# Patient Record
Sex: Male | Born: 1985 | Race: Black or African American | Hispanic: No | Marital: Single | Smoking: Current every day smoker
Health system: Southern US, Community
[De-identification: ages and names within clinical notes are randomized; demographics above are authoritative.]

## PROBLEM LIST (undated history)

## (undated) DIAGNOSIS — J189 Pneumonia, unspecified organism: Secondary | ICD-10-CM

## (undated) DIAGNOSIS — K219 Gastro-esophageal reflux disease without esophagitis: Secondary | ICD-10-CM

## (undated) DIAGNOSIS — J40 Bronchitis, not specified as acute or chronic: Secondary | ICD-10-CM

## (undated) DIAGNOSIS — J302 Other seasonal allergic rhinitis: Secondary | ICD-10-CM

## (undated) HISTORY — PX: ADENOIDECTOMY: SUR15

## (undated) HISTORY — PX: TONSILLECTOMY: SUR1361

---
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
## 2004-07-30 ENCOUNTER — Emergency Department (HOSPITAL_COMMUNITY): Admission: EM | Admit: 2004-07-30 | Discharge: 2004-07-31 | Payer: Self-pay | Admitting: Emergency Medicine

## 2004-08-09 ENCOUNTER — Emergency Department (HOSPITAL_COMMUNITY): Admission: EM | Admit: 2004-08-09 | Discharge: 2004-08-09 | Payer: Self-pay | Admitting: Emergency Medicine

## 2004-08-10 ENCOUNTER — Emergency Department (HOSPITAL_COMMUNITY): Admission: EM | Admit: 2004-08-10 | Discharge: 2004-08-10 | Payer: Self-pay | Admitting: Emergency Medicine

## 2004-09-17 ENCOUNTER — Emergency Department (HOSPITAL_COMMUNITY): Admission: EM | Admit: 2004-09-17 | Discharge: 2004-09-17 | Payer: Self-pay | Admitting: Emergency Medicine

## 2005-07-22 ENCOUNTER — Emergency Department (HOSPITAL_COMMUNITY): Admission: EM | Admit: 2005-07-22 | Discharge: 2005-07-23 | Payer: Self-pay | Admitting: Emergency Medicine

## 2005-07-27 ENCOUNTER — Emergency Department (HOSPITAL_COMMUNITY): Admission: EM | Admit: 2005-07-27 | Discharge: 2005-07-27 | Payer: Self-pay | Admitting: Family Medicine

## 2006-02-12 ENCOUNTER — Emergency Department (HOSPITAL_COMMUNITY): Admission: EM | Admit: 2006-02-12 | Discharge: 2006-02-12 | Payer: Self-pay | Admitting: Family Medicine

## 2006-09-23 ENCOUNTER — Emergency Department (HOSPITAL_COMMUNITY): Admission: EM | Admit: 2006-09-23 | Discharge: 2006-09-23 | Payer: Self-pay | Admitting: Emergency Medicine

## 2006-10-02 ENCOUNTER — Emergency Department (HOSPITAL_COMMUNITY): Admission: EM | Admit: 2006-10-02 | Discharge: 2006-10-02 | Payer: Self-pay | Admitting: Emergency Medicine

## 2006-10-13 ENCOUNTER — Emergency Department (HOSPITAL_COMMUNITY): Admission: EM | Admit: 2006-10-13 | Discharge: 2006-10-14 | Payer: Self-pay | Admitting: Emergency Medicine

## 2007-02-13 ENCOUNTER — Emergency Department (HOSPITAL_COMMUNITY): Admission: EM | Admit: 2007-02-13 | Discharge: 2007-02-13 | Payer: Self-pay | Admitting: Emergency Medicine

## 2007-02-16 ENCOUNTER — Emergency Department (HOSPITAL_COMMUNITY): Admission: EM | Admit: 2007-02-16 | Discharge: 2007-02-17 | Payer: Self-pay | Admitting: *Deleted

## 2007-03-02 ENCOUNTER — Emergency Department (HOSPITAL_COMMUNITY): Admission: EM | Admit: 2007-03-02 | Discharge: 2007-03-03 | Payer: Self-pay | Admitting: Emergency Medicine

## 2007-03-24 ENCOUNTER — Emergency Department (HOSPITAL_COMMUNITY): Admission: EM | Admit: 2007-03-24 | Discharge: 2007-03-24 | Payer: Self-pay | Admitting: Family Medicine

## 2007-05-01 ENCOUNTER — Emergency Department (HOSPITAL_COMMUNITY): Admission: EM | Admit: 2007-05-01 | Discharge: 2007-05-01 | Payer: Self-pay | Admitting: Emergency Medicine

## 2007-05-20 ENCOUNTER — Emergency Department (HOSPITAL_COMMUNITY): Admission: EM | Admit: 2007-05-20 | Discharge: 2007-05-21 | Payer: Self-pay | Admitting: Emergency Medicine

## 2007-05-25 ENCOUNTER — Emergency Department (HOSPITAL_COMMUNITY): Admission: EM | Admit: 2007-05-25 | Discharge: 2007-05-25 | Payer: Self-pay | Admitting: Emergency Medicine

## 2007-05-26 ENCOUNTER — Emergency Department (HOSPITAL_COMMUNITY): Admission: EM | Admit: 2007-05-26 | Discharge: 2007-05-26 | Payer: Self-pay | Admitting: Emergency Medicine

## 2007-07-18 ENCOUNTER — Emergency Department (HOSPITAL_COMMUNITY): Admission: EM | Admit: 2007-07-18 | Discharge: 2007-07-18 | Payer: Self-pay | Admitting: Family Medicine

## 2007-09-12 ENCOUNTER — Emergency Department (HOSPITAL_COMMUNITY): Admission: EM | Admit: 2007-09-12 | Discharge: 2007-09-12 | Payer: Self-pay | Admitting: Emergency Medicine

## 2007-10-26 ENCOUNTER — Emergency Department (HOSPITAL_COMMUNITY): Admission: EM | Admit: 2007-10-26 | Discharge: 2007-10-26 | Payer: Self-pay | Admitting: Emergency Medicine

## 2008-01-26 ENCOUNTER — Emergency Department (HOSPITAL_COMMUNITY): Admission: EM | Admit: 2008-01-26 | Discharge: 2008-01-27 | Payer: Self-pay | Admitting: Emergency Medicine

## 2008-06-14 ENCOUNTER — Emergency Department (HOSPITAL_COMMUNITY): Admission: EM | Admit: 2008-06-14 | Discharge: 2008-06-14 | Payer: Self-pay | Admitting: Emergency Medicine

## 2008-06-27 ENCOUNTER — Emergency Department (HOSPITAL_COMMUNITY): Admission: EM | Admit: 2008-06-27 | Discharge: 2008-06-27 | Payer: Self-pay | Admitting: Family Medicine

## 2008-08-24 ENCOUNTER — Emergency Department (HOSPITAL_COMMUNITY): Admission: EM | Admit: 2008-08-24 | Discharge: 2008-08-24 | Payer: Self-pay | Admitting: Family Medicine

## 2008-12-18 ENCOUNTER — Emergency Department (HOSPITAL_COMMUNITY): Admission: EM | Admit: 2008-12-18 | Discharge: 2008-12-18 | Payer: Self-pay | Admitting: Emergency Medicine

## 2009-01-15 IMAGING — CR DG CHEST 2V
2 series · 2 of 2 positions shown · non-contrast
Comparison: 10/26/2007

CLINICAL DATA: Chest congestion and fever

CHEST - 2 VIEW

[w chest pa]
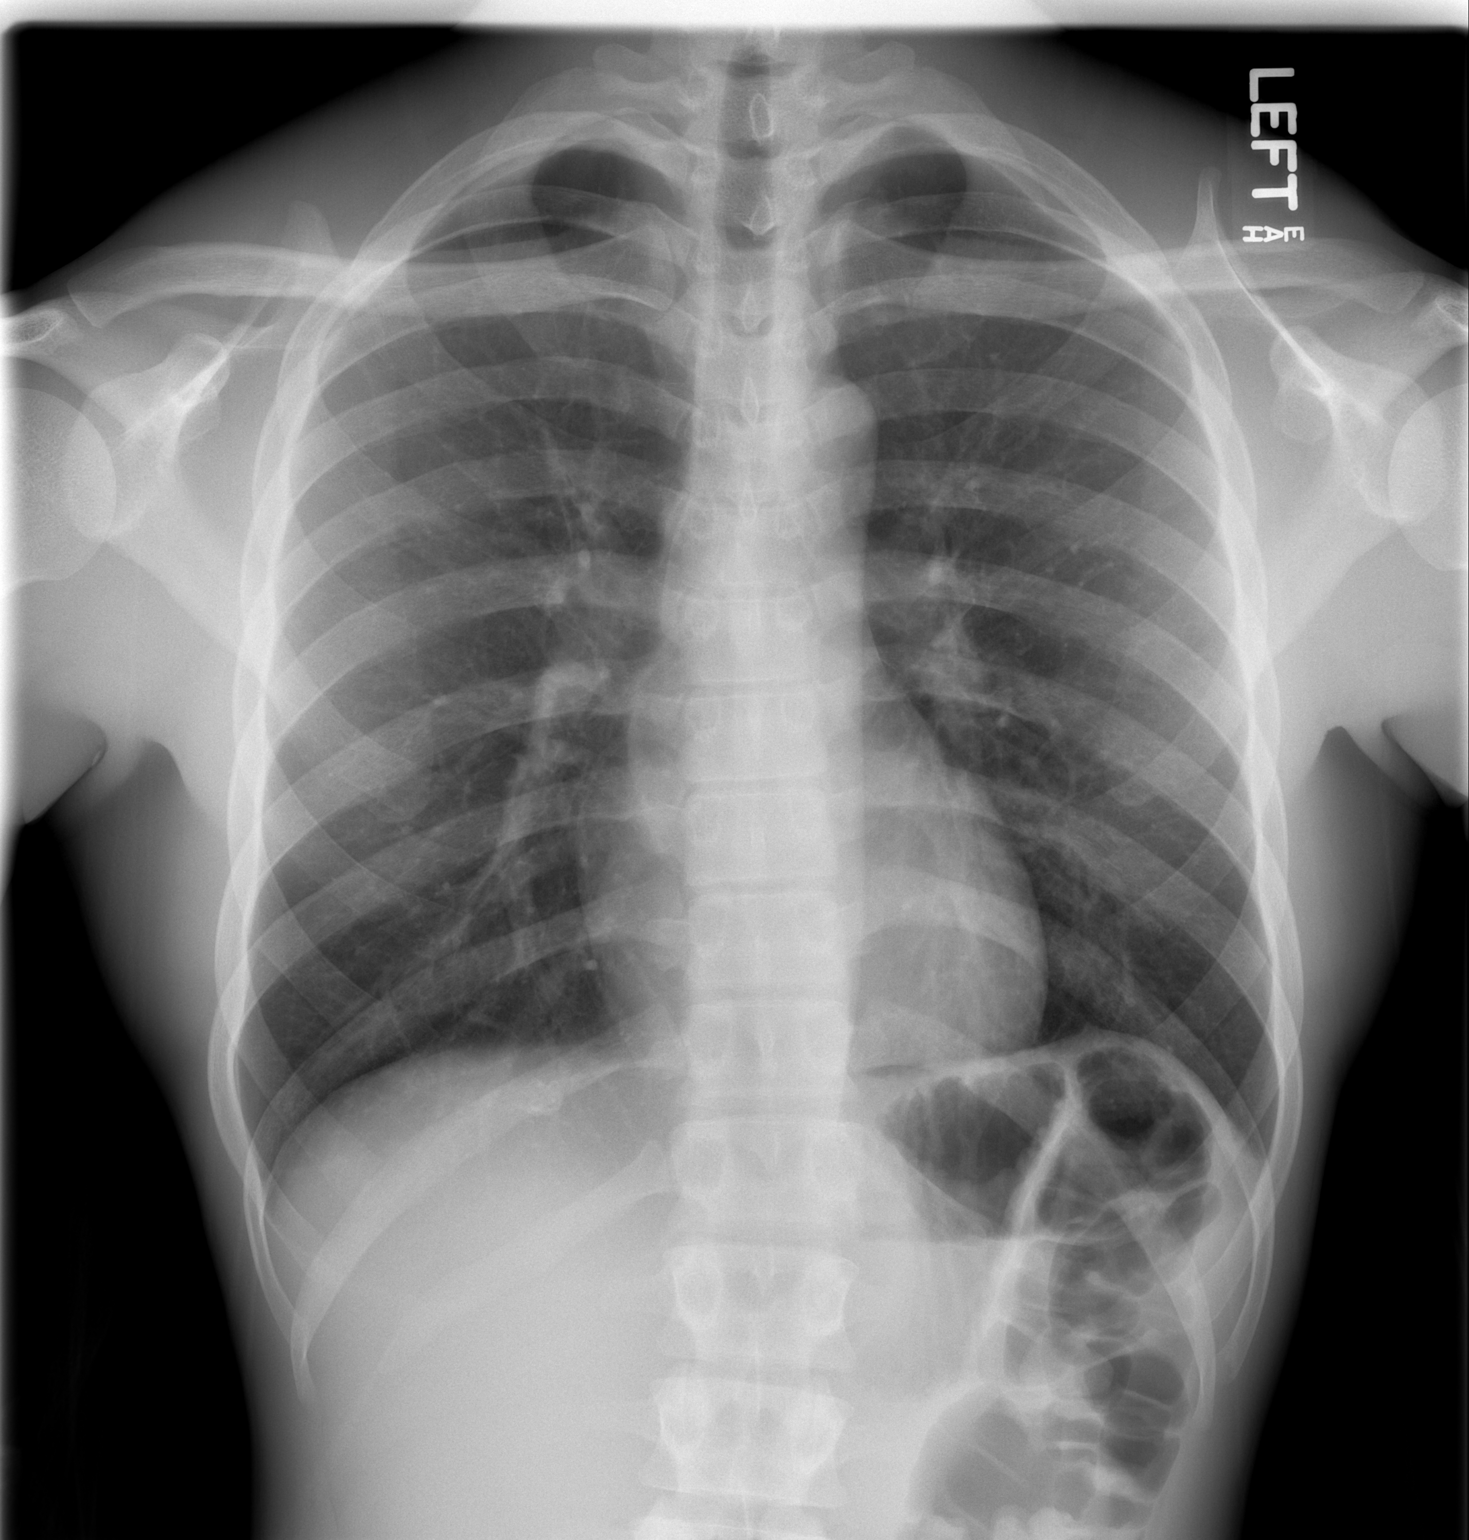

[w chest lat]
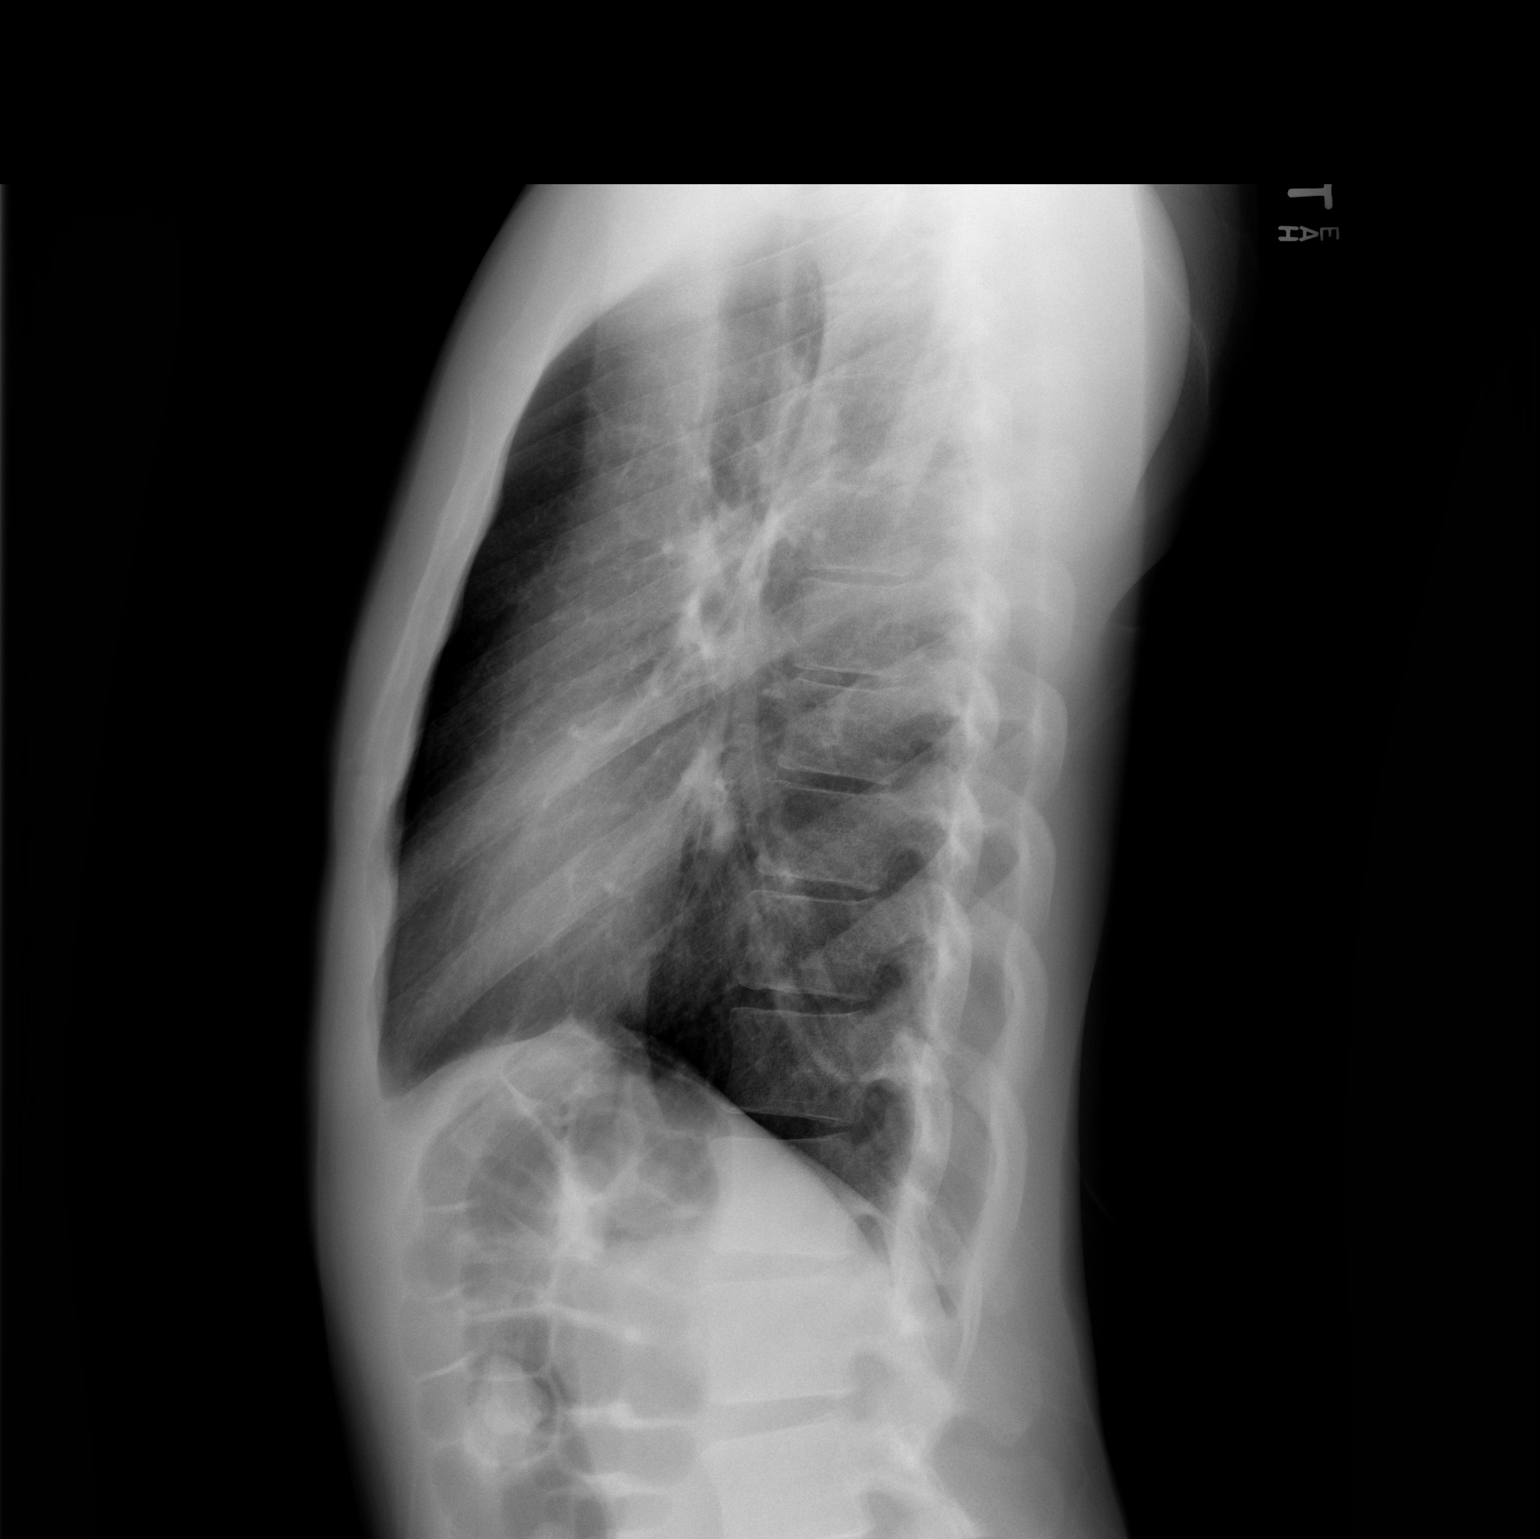

[2 of 2 positions shown; findings below may reference images not displayed]

FINDINGS: The heart size and mediastinal contours are within normal
limits.  Both lungs are clear.  The visualized skeletal structures
are unremarkable.
IMPRESSION: No acute cardiopulmonary abnormalities.

## 2009-08-10 ENCOUNTER — Emergency Department (HOSPITAL_COMMUNITY): Admission: EM | Admit: 2009-08-10 | Discharge: 2009-08-10 | Payer: Self-pay | Admitting: Emergency Medicine

## 2009-08-12 ENCOUNTER — Emergency Department (HOSPITAL_COMMUNITY): Admission: EM | Admit: 2009-08-12 | Discharge: 2009-08-12 | Payer: Self-pay | Admitting: Emergency Medicine

## 2009-08-13 ENCOUNTER — Emergency Department (HOSPITAL_COMMUNITY): Admission: EM | Admit: 2009-08-13 | Discharge: 2009-08-14 | Payer: Self-pay | Admitting: Emergency Medicine

## 2009-08-14 ENCOUNTER — Emergency Department (HOSPITAL_COMMUNITY): Admission: EM | Admit: 2009-08-14 | Discharge: 2009-08-14 | Payer: Self-pay | Admitting: Family Medicine

## 2009-08-31 ENCOUNTER — Emergency Department (HOSPITAL_COMMUNITY): Admission: EM | Admit: 2009-08-31 | Discharge: 2009-08-31 | Payer: Self-pay | Admitting: Emergency Medicine

## 2009-12-08 ENCOUNTER — Emergency Department (HOSPITAL_COMMUNITY): Admission: EM | Admit: 2009-12-08 | Discharge: 2009-12-08 | Payer: Self-pay | Admitting: Family Medicine

## 2009-12-12 ENCOUNTER — Emergency Department (HOSPITAL_COMMUNITY): Admission: EM | Admit: 2009-12-12 | Discharge: 2009-12-12 | Payer: Self-pay | Admitting: Emergency Medicine

## 2010-02-04 ENCOUNTER — Emergency Department (HOSPITAL_COMMUNITY)
Admission: EM | Admit: 2010-02-04 | Discharge: 2010-02-04 | Payer: Self-pay | Source: Home / Self Care | Admitting: Family Medicine

## 2010-02-13 ENCOUNTER — Emergency Department (HOSPITAL_COMMUNITY)
Admission: EM | Admit: 2010-02-13 | Discharge: 2010-02-13 | Payer: Self-pay | Source: Home / Self Care | Admitting: Emergency Medicine

## 2010-04-18 LAB — DIFFERENTIAL
Basophils Absolute: 0 10*3/uL (ref 0.0–0.1)
Basophils Relative: 0 % (ref 0–1)
Lymphocytes Relative: 8 % — ABNORMAL LOW (ref 12–46)
Lymphs Abs: 0.9 10*3/uL (ref 0.7–4.0)
Monocytes Relative: 9 % (ref 3–12)
Neutrophils Relative %: 84 % — ABNORMAL HIGH (ref 43–77)

## 2010-04-18 LAB — CBC
Hemoglobin: 14.2 g/dL (ref 13.0–17.0)
MCHC: 32 g/dL (ref 30.0–36.0)
RBC: 6.08 MIL/uL — ABNORMAL HIGH (ref 4.22–5.81)
WBC: 12.1 10*3/uL — ABNORMAL HIGH (ref 4.0–10.5)

## 2010-05-05 LAB — POCT I-STAT, CHEM 8
BUN: 7 mg/dL (ref 6–23)
Calcium, Ion: 1.23 mmol/L (ref 1.12–1.32)
Glucose, Bld: 93 mg/dL (ref 70–99)
Hemoglobin: 14.3 g/dL (ref 13.0–17.0)
Potassium: 3.8 mEq/L (ref 3.5–5.1)
Sodium: 140 mEq/L (ref 135–145)

## 2010-05-09 LAB — RPR: RPR Ser Ql: NONREACTIVE

## 2010-05-09 LAB — GC/CHLAMYDIA PROBE AMP, GENITAL: GC Probe Amp, Genital: NEGATIVE

## 2010-05-11 LAB — COMPREHENSIVE METABOLIC PANEL
AST: 19 U/L (ref 0–37)
Albumin: 3.6 g/dL (ref 3.5–5.2)
Alkaline Phosphatase: 60 U/L (ref 39–117)
BUN: 10 mg/dL (ref 6–23)
CO2: 22 mEq/L (ref 19–32)
Chloride: 109 mEq/L (ref 96–112)
GFR calc non Af Amer: >60 mL/min (ref >60)
Sodium: 138 mEq/L (ref 135–145)
Total Protein: 5.7 g/dL — ABNORMAL LOW (ref 6.0–8.3)

## 2010-05-11 LAB — CBC
MCV: 72.5 fL — ABNORMAL LOW (ref 78.0–100.0)
Platelets: 187 10*3/uL (ref 150–400)

## 2010-05-11 LAB — GC/CHLAMYDIA PROBE AMP, GENITAL: Chlamydia, DNA Probe: NEGATIVE

## 2010-05-11 LAB — DIFFERENTIAL
Basophils Absolute: 0 10*3/uL (ref 0.0–0.1)
Basophils Relative: 1 % (ref 0–1)
Eosinophils Relative: 3 % (ref 0–5)
Monocytes Absolute: 0.5 10*3/uL (ref 0.1–1.0)

## 2010-05-11 LAB — GLUCOSE, CAPILLARY: Glucose-Capillary: 88 mg/dL (ref 70–99)

## 2010-08-04 ENCOUNTER — Emergency Department (HOSPITAL_COMMUNITY)
Admission: EM | Admit: 2010-08-04 | Discharge: 2010-08-04 | Disposition: A | Discharge status: Home / Self Care | Payer: Self-pay | Attending: Emergency Medicine | Admitting: Emergency Medicine

## 2010-08-04 DIAGNOSIS — R079 Chest pain, unspecified: Secondary | ICD-10-CM | POA: Insufficient documentation

## 2010-08-04 DIAGNOSIS — K219 Gastro-esophageal reflux disease without esophagitis: Secondary | ICD-10-CM | POA: Insufficient documentation

## 2010-08-04 DIAGNOSIS — F1410 Cocaine abuse, uncomplicated: Secondary | ICD-10-CM | POA: Insufficient documentation

## 2010-08-04 DIAGNOSIS — R0602 Shortness of breath: Secondary | ICD-10-CM | POA: Insufficient documentation

## 2010-08-04 DIAGNOSIS — R0989 Other specified symptoms and signs involving the circulatory and respiratory systems: Secondary | ICD-10-CM | POA: Insufficient documentation

## 2010-08-04 DIAGNOSIS — Z79899 Other long term (current) drug therapy: Secondary | ICD-10-CM | POA: Insufficient documentation

## 2010-08-04 DIAGNOSIS — R0609 Other forms of dyspnea: Secondary | ICD-10-CM | POA: Insufficient documentation

## 2010-08-21 ENCOUNTER — Inpatient Hospital Stay (INDEPENDENT_AMBULATORY_CARE_PROVIDER_SITE_OTHER)
Admission: RE | Admit: 2010-08-21 | Discharge: 2010-08-21 | Disposition: A | Discharge status: Home / Self Care | Payer: Self-pay | Source: Ambulatory Visit | Attending: Family Medicine | Admitting: Family Medicine

## 2010-08-21 DIAGNOSIS — J069 Acute upper respiratory infection, unspecified: Secondary | ICD-10-CM

## 2010-09-02 ENCOUNTER — Emergency Department (HOSPITAL_COMMUNITY)
Admission: EM | Admit: 2010-09-02 | Discharge: 2010-09-03 | Disposition: A | Discharge status: Home / Self Care | Payer: Self-pay | Attending: Emergency Medicine | Admitting: Emergency Medicine

## 2010-09-02 ENCOUNTER — Emergency Department (HOSPITAL_COMMUNITY): Payer: Self-pay

## 2010-09-02 DIAGNOSIS — J40 Bronchitis, not specified as acute or chronic: Secondary | ICD-10-CM | POA: Insufficient documentation

## 2010-09-02 DIAGNOSIS — K219 Gastro-esophageal reflux disease without esophagitis: Secondary | ICD-10-CM | POA: Insufficient documentation

## 2010-09-02 DIAGNOSIS — R6883 Chills (without fever): Secondary | ICD-10-CM | POA: Insufficient documentation

## 2010-09-02 DIAGNOSIS — R05 Cough: Secondary | ICD-10-CM | POA: Insufficient documentation

## 2010-09-02 DIAGNOSIS — R059 Cough, unspecified: Secondary | ICD-10-CM | POA: Insufficient documentation

## 2010-10-07 ENCOUNTER — Inpatient Hospital Stay (INDEPENDENT_AMBULATORY_CARE_PROVIDER_SITE_OTHER)
Admission: RE | Admit: 2010-10-07 | Discharge: 2010-10-07 | Disposition: A | Discharge status: Home / Self Care | Payer: Self-pay | Source: Ambulatory Visit | Attending: Family Medicine | Admitting: Family Medicine

## 2010-10-07 DIAGNOSIS — R51 Headache: Secondary | ICD-10-CM

## 2010-10-07 DIAGNOSIS — Z733 Stress, not elsewhere classified: Secondary | ICD-10-CM

## 2010-10-07 LAB — GLUCOSE, CAPILLARY: Glucose-Capillary: 74 mg/dL (ref 70–99)

## 2010-10-11 ENCOUNTER — Emergency Department (HOSPITAL_COMMUNITY): Payer: Self-pay

## 2010-10-11 ENCOUNTER — Emergency Department (HOSPITAL_COMMUNITY)
Admission: EM | Admit: 2010-10-11 | Discharge: 2010-10-11 | Disposition: A | Discharge status: Home / Self Care | Payer: Self-pay | Attending: Emergency Medicine | Admitting: Emergency Medicine

## 2010-10-11 DIAGNOSIS — M545 Low back pain, unspecified: Secondary | ICD-10-CM | POA: Insufficient documentation

## 2010-10-11 DIAGNOSIS — M542 Cervicalgia: Secondary | ICD-10-CM | POA: Insufficient documentation

## 2010-10-11 DIAGNOSIS — R29898 Other symptoms and signs involving the musculoskeletal system: Secondary | ICD-10-CM | POA: Insufficient documentation

## 2010-10-11 DIAGNOSIS — F0781 Postconcussional syndrome: Secondary | ICD-10-CM | POA: Insufficient documentation

## 2010-10-11 DIAGNOSIS — R51 Headache: Secondary | ICD-10-CM | POA: Insufficient documentation

## 2010-10-11 DIAGNOSIS — R209 Unspecified disturbances of skin sensation: Secondary | ICD-10-CM | POA: Insufficient documentation

## 2010-10-11 DIAGNOSIS — H538 Other visual disturbances: Secondary | ICD-10-CM | POA: Insufficient documentation

## 2010-10-11 DIAGNOSIS — K219 Gastro-esophageal reflux disease without esophagitis: Secondary | ICD-10-CM | POA: Insufficient documentation

## 2010-10-11 DIAGNOSIS — R279 Unspecified lack of coordination: Secondary | ICD-10-CM | POA: Insufficient documentation

## 2010-10-11 DIAGNOSIS — R110 Nausea: Secondary | ICD-10-CM | POA: Insufficient documentation

## 2010-10-21 LAB — URINALYSIS, ROUTINE W REFLEX MICROSCOPIC
Bilirubin Urine: NEGATIVE
Specific Gravity, Urine: 1.022
pH: 6.5

## 2010-10-21 LAB — URINE MICROSCOPIC-ADD ON

## 2010-10-22 LAB — GC/CHLAMYDIA PROBE AMP, GENITAL: Chlamydia, DNA Probe: NEGATIVE

## 2011-02-27 ENCOUNTER — Emergency Department (HOSPITAL_COMMUNITY): Payer: Self-pay

## 2011-02-27 ENCOUNTER — Emergency Department (HOSPITAL_COMMUNITY)
Admission: EM | Admit: 2011-02-27 | Discharge: 2011-02-27 | Disposition: A | Discharge status: Home / Self Care | Payer: Self-pay | Attending: Emergency Medicine | Admitting: Emergency Medicine

## 2011-02-27 ENCOUNTER — Encounter (HOSPITAL_COMMUNITY): Payer: Self-pay | Admitting: *Deleted

## 2011-02-27 DIAGNOSIS — F17200 Nicotine dependence, unspecified, uncomplicated: Secondary | ICD-10-CM | POA: Insufficient documentation

## 2011-02-27 DIAGNOSIS — R0602 Shortness of breath: Secondary | ICD-10-CM | POA: Insufficient documentation

## 2011-02-27 DIAGNOSIS — R05 Cough: Secondary | ICD-10-CM | POA: Insufficient documentation

## 2011-02-27 DIAGNOSIS — R059 Cough, unspecified: Secondary | ICD-10-CM | POA: Insufficient documentation

## 2011-02-27 HISTORY — DX: Gastro-esophageal reflux disease without esophagitis: K21.9

## 2011-02-27 HISTORY — DX: Bronchitis, not specified as acute or chronic: J40

## 2011-02-27 NOTE — ED Notes (Signed)
Per EMS, Pt inhaled freon today while working on air conditioner.  Pt c/o shortness of breath, cough, sore throat.

## 2011-02-27 NOTE — ED Notes (Signed)
ZOX:[id number]<AV> Expected date:02/27/11<BR> Expected time: 8:42 PM<BR> Means of arrival:Ambulance<BR> Comments:<BR> EMS 80 GC, 25 yom freeon vapors to face, cough x&#39;s 1 week

## 2011-02-27 NOTE — ED Provider Notes (Signed)
History     CSN: [id number]  Arrival date & time 02/27/11  2049   First MD Initiated Contact with Patient 02/27/11 2101      Chief Complaint  Patient presents with  . Shortness of Breath     Patient is a 26 y.o. male presenting with shortness of breath.  Shortness of Breath  Associated symptoms include shortness of breath. Pertinent negatives include no chest pain and no wheezing.   History provided by the patient. Patient is a 26 year-old male with no significant past medical history who presents with complaints of shortness of breath and chest discomfort after inhaling freeon gas from air conditioning unit. Patient states that he has friends for breaking apart and hold air conditioning unit for parts when he cut a line exposing the freeon gas. Patient reports that his symptoms began shortly after that and have been persistent. Patient reports trying to get away from the area quickly. Patient reported some slight coughing this has improved. He denies any lightheadedness, near syncope. Patient is a current smoker and does report past history as a bronchitis. Patient has no history of asthma symptoms.   Past Medical History  Diagnosis Date  . Bronchitis   . Acid reflux disease     History reviewed. No pertinent past surgical history.  History reviewed. No pertinent family history.  History  Substance Use Topics  . Smoking status: Current Everyday Smoker -- 1.0 packs/day    Types: Cigarettes  . Smokeless tobacco: Not on file  . Alcohol Use: Yes     social drinker      Review of Systems  Respiratory: Positive for shortness of breath. Negative for wheezing.   Cardiovascular: Negative for chest pain.  Gastrointestinal: Negative for abdominal pain.  All other systems reviewed and are negative.    Allergies  Review of patient's allergies indicates no known allergies.  Home Medications  No current outpatient prescriptions on file.  BP 138/87  Pulse 62  Temp(Src) 98.8  F (37.1 C) (Oral)  Resp 16  SpO2 100%  Physical Exam  Nursing note and vitals reviewed. Constitutional: He is oriented to person, place, and time. He appears well-developed and well-nourished. No distress.  HENT:  Head: Normocephalic.  Mouth/Throat: Oropharynx is clear and moist.  Neck: Normal range of motion.  Cardiovascular: Normal rate and regular rhythm.   No murmur heard. Pulmonary/Chest: Effort normal and breath sounds normal. No stridor. No respiratory distress. He has no wheezes. He has no rales. He exhibits no tenderness.  Abdominal: Soft. He exhibits no distension. There is no tenderness.  Neurological: He is alert and oriented to person, place, and time.  Skin: Skin is warm and dry.  Psychiatric: He has a normal mood and affect. His behavior is normal.    ED Course  Procedures   Labs Reviewed - No data to display Dg Chest 2 View  02/27/2011  *RADIOLOGY REPORT*  Clinical Data: Cough, shortness of breath  CHEST - 2 VIEW  Comparison: 09/02/2010  Findings: Normal heart size, mediastinal contours, and pulmonary vascularity. Peribronchial thickening and hyperinflation, question asthma or bronchitis. No pulmonary infiltrate, pleural effusion, or pneumothorax. No acute bony findings.  IMPRESSION: Peribronchial thickening and hyperinflation question asthma versus bronchitis. No acute infiltrate.  Original Report Authenticated By: Lollie Marrow, M.D.     1. Shortness of breath       MDM  9:00 PM patient seen and evaluated. Patient in no acute distress. Patient with normal respirations and O2 sats at  100% on room air. Lungs are clear.        Angus Seller, [place] 02/28/11 [phone]

## 2011-02-27 NOTE — ED Notes (Signed)
Vitals b/p 120/70, pulse 84, spo2 100% on room air in transport.

## 2011-02-27 NOTE — ED Notes (Signed)
Patient brought in by EMS from home after inhaling freeon gas from air conditioner unit approx. 1 hr ago. Pt. CO difficulty breathing, chest tightness and pain with inspiration/expiration that radiates to his back accompanied with nausea. VSS.

## 2011-02-28 NOTE — ED Provider Notes (Signed)
Medical screening examination/treatment/procedure(s) were performed by non-physician practitioner and as supervising physician I was immediately available for consultation/collaboration.  Cyndra Numbers, MD 02/28/11 1246

## 2011-03-27 ENCOUNTER — Encounter (HOSPITAL_COMMUNITY): Payer: Self-pay | Admitting: *Deleted

## 2011-03-27 ENCOUNTER — Emergency Department (HOSPITAL_COMMUNITY)
Admission: EM | Admit: 2011-03-27 | Discharge: 2011-03-27 | Disposition: A | Discharge status: Home / Self Care | Payer: Self-pay | Attending: Emergency Medicine | Admitting: Emergency Medicine

## 2011-03-27 DIAGNOSIS — R21 Rash and other nonspecific skin eruption: Secondary | ICD-10-CM | POA: Insufficient documentation

## 2011-03-27 DIAGNOSIS — L299 Pruritus, unspecified: Secondary | ICD-10-CM | POA: Insufficient documentation

## 2011-03-27 DIAGNOSIS — T148 Other injury of unspecified body region: Secondary | ICD-10-CM | POA: Insufficient documentation

## 2011-03-27 DIAGNOSIS — F17200 Nicotine dependence, unspecified, uncomplicated: Secondary | ICD-10-CM | POA: Insufficient documentation

## 2011-03-27 DIAGNOSIS — W57XXXA Bitten or stung by nonvenomous insect and other nonvenomous arthropods, initial encounter: Secondary | ICD-10-CM | POA: Insufficient documentation

## 2011-03-27 DIAGNOSIS — K219 Gastro-esophageal reflux disease without esophagitis: Secondary | ICD-10-CM | POA: Insufficient documentation

## 2011-03-27 MED ORDER — DIPHENHYDRAMINE HCL 25 MG PO CAPS
50.0000 mg | ORAL_CAPSULE | Freq: Once | ORAL | Status: DC
  Filled 2011-03-27: qty 2

## 2011-03-27 MED ORDER — PREDNISONE 20 MG PO TABS
60.0000 mg | ORAL_TABLET | Freq: Once | ORAL | Status: AC
  Administered 2011-03-27: 60 mg via ORAL
  Filled 2011-03-27: qty 3

## 2011-03-27 MED ORDER — PREDNISONE 20 MG PO TABS: ORAL_TABLET | ORAL | Status: AC

## 2011-03-27 MED ORDER — DIPHENHYDRAMINE HCL 25 MG PO CAPS: 25.0000 mg | ORAL_CAPSULE | Freq: Four times a day (QID) | ORAL | Status: DC | PRN

## 2011-03-27 NOTE — ED Provider Notes (Signed)
History     CSN: [id number]  Arrival date & time 03/27/11  4098   First MD Initiated Contact with Patient 03/27/11 1930      Chief Complaint  Patient presents with  . Rash    (Consider location/radiation/quality/duration/timing/severity/associated sxs/prior treatment) Patient is a 26 y.o. male presenting with rash. The history is provided by the patient. No language interpreter was used.  Rash  This is a new problem. The current episode started more than 2 days ago (approx 1 week). The problem has been gradually worsening. The problem is associated with an insect bite/sting (bed bug exposure). There has been no fever. The rash is present on the torso and back. The patient is experiencing no pain. Associated symptoms include itching. Pertinent negatives include no pain. He has tried nothing for the symptoms.    Past Medical History  Diagnosis Date  . Bronchitis   . Acid reflux disease     History reviewed. No pertinent past surgical history.  No family history on file.  History  Substance Use Topics  . Smoking status: Current Everyday Smoker -- 1.0 packs/day    Types: Cigarettes  . Smokeless tobacco: Not on file  . Alcohol Use: Yes     social drinker      Review of Systems  Constitutional: Negative for fever, chills, activity change, appetite change and fatigue.  HENT: Negative for congestion, sore throat, rhinorrhea, neck pain and neck stiffness.   Respiratory: Negative for cough and shortness of breath.   Cardiovascular: Negative for chest pain and palpitations.  Gastrointestinal: Negative for nausea, vomiting and abdominal pain.  Genitourinary: Negative for dysuria, urgency, frequency and flank pain.  Musculoskeletal: Negative for myalgias, back pain and arthralgias.  Skin: Positive for itching and rash.  Neurological: Negative for dizziness, weakness, light-headedness, numbness and headaches.  All other systems reviewed and are negative.    Allergies  Review  of patient's allergies indicates no known allergies.  Home Medications   Current Outpatient Rx  Name Route Sig Dispense Refill  . DIPHENHYDRAMINE HCL 25 MG PO CAPS Oral Take 1 capsule (25 mg total) by mouth every 6 (six) hours as needed for itching. 30 capsule 0  . PREDNISONE 20 MG PO TABS  3 Tabs PO Days 1-3, then 2 tabs PO Days 4-6, then 1 tab PO Day 7-9, then Half Tab PO Day 10-12 20 tablet 0    BP 119/74  Pulse 76  Temp(Src) 98.3 F (36.8 C) (Oral)  Resp 16  SpO2 100%  Physical Exam  Nursing note and vitals reviewed. Constitutional: He is oriented to person, place, and time. He appears well-developed and well-nourished. No distress.  HENT:  Head: Normocephalic and atraumatic.  Mouth/Throat: Oropharynx is clear and moist. No oropharyngeal exudate.  Eyes: Conjunctivae and EOM are normal. Pupils are equal, round, and reactive to light.  Neck: Normal range of motion. Neck supple.  Cardiovascular: Normal rate, regular rhythm, normal heart sounds and intact distal pulses.  Exam reveals no gallop and no friction rub.   No murmur heard. Pulmonary/Chest: Effort normal and breath sounds normal. No respiratory distress.  Abdominal: Soft. Bowel sounds are normal. There is no tenderness.  Musculoskeletal: Normal range of motion.  Neurological: He is alert and oriented to person, place, and time. No cranial nerve deficit.  Skin: Skin is warm and dry. Rash (erythematous maculopapular rash to the torso) noted.    ED Course  Procedures (including critical care time)  Labs Reviewed - No data to display No  results found.   1. Rash   2. Bed bug bite       MDM  Allergic type reaction likely secondary to a bed bug bite. Received a dose of prednisone and Benadryl in the emergency department. He'll be placed on a prednisone taper as well as Benadryl. Chart and followup with primary care physician. Provided eradication techniques.       Dayton Bailiff, MD 03/27/11 [phone]

## 2011-03-27 NOTE — ED Notes (Signed)
The pt refused the benadryl he did not want  To get drowst

## 2011-03-27 NOTE — ED Notes (Signed)
The pt has had a fine raised red rash for approx one week over his upper torso.  It has been itching him.  He has had bed bugs from the place he moved into

## 2011-03-27 NOTE — Discharge Instructions (Signed)

## 2011-04-09 ENCOUNTER — Encounter (HOSPITAL_COMMUNITY): Payer: Self-pay | Admitting: *Deleted

## 2011-04-09 ENCOUNTER — Other Ambulatory Visit: Payer: Self-pay

## 2011-04-09 ENCOUNTER — Emergency Department (HOSPITAL_COMMUNITY): Payer: Self-pay

## 2011-04-09 ENCOUNTER — Emergency Department (HOSPITAL_COMMUNITY)
Admission: EM | Admit: 2011-04-09 | Discharge: 2011-04-10 | Disposition: A | Discharge status: Home / Self Care | Payer: Self-pay | Attending: Emergency Medicine | Admitting: Emergency Medicine

## 2011-04-09 DIAGNOSIS — R079 Chest pain, unspecified: Secondary | ICD-10-CM | POA: Insufficient documentation

## 2011-04-09 DIAGNOSIS — R51 Headache: Secondary | ICD-10-CM | POA: Insufficient documentation

## 2011-04-09 DIAGNOSIS — F17200 Nicotine dependence, unspecified, uncomplicated: Secondary | ICD-10-CM | POA: Insufficient documentation

## 2011-04-09 HISTORY — DX: Gastro-esophageal reflux disease without esophagitis: K21.9

## 2011-04-09 MED ORDER — HYDROMORPHONE HCL PF 1 MG/ML IJ SOLN
1.0000 mg | Freq: Once | INTRAMUSCULAR | Status: DC
  Filled 2011-04-09: qty 1

## 2011-04-09 MED ORDER — DIPHENHYDRAMINE HCL 50 MG/ML IJ SOLN
25.0000 mg | Freq: Once | INTRAMUSCULAR | Status: DC
  Filled 2011-04-09: qty 1

## 2011-04-09 MED ORDER — SODIUM CHLORIDE 0.9 % IV BOLUS (SEPSIS)
1000.0000 mL | Freq: Once | INTRAVENOUS | Status: AC
  Administered 2011-04-09: 1000 mL via INTRAVENOUS

## 2011-04-09 MED ORDER — METOCLOPRAMIDE HCL 5 MG/ML IJ SOLN
10.0000 mg | Freq: Once | INTRAMUSCULAR | Status: AC
  Administered 2011-04-09: 10 mg via INTRAVENOUS
  Filled 2011-04-09: qty 2

## 2011-04-09 NOTE — ED Notes (Signed)
Pt stating that he has a stabbing pain in his left chest and shoulder with intermittent left arm numbness.  Pt states that he gets light headed upon standing.  Pt states that his pain is 9/10, pt noted to be texting and talking to his visitor at his bedside.  Pt does not appear to be in observable distress.

## 2011-04-09 NOTE — ED Notes (Signed)
Tonight, the pt was drinking moonshine (Anthony Pruitt and white mixed together), with his friends.  Pt then started shaking all over and reports headache, shaking, and difficulty swallowing water.

## 2011-04-10 MED ORDER — IBUPROFEN 800 MG PO TABS: 800.0000 mg | ORAL_TABLET | Freq: Three times a day (TID) | ORAL | Status: AC

## 2011-04-10 NOTE — ED Provider Notes (Signed)
History     CSN: [id number]  Arrival date & time 04/09/11  2213   First MD Initiated Contact with Patient 04/09/11 2310      Chief Complaint  Patient presents with  . Shaking  . Headache    (Consider location/radiation/quality/duration/timing/severity/associated sxs/prior treatment) The history is provided by the patient.   left sided chest pain started tonight at home while arrest. Pain is sharp in quality and severe. Pain is worse with any movement of his left arm and any palpation of that area. He denies any trauma. No rash. He is a smoker. He denies any recent illness, cough cold or congestion. No history of similar pain. No nausea or vomiting. He has had some headache and admits to drinking alcohol today. He became very anxious after chest pain developed and states she's having difficulty swallowing including swallowing water. This has resolved and he is now able to drink water without difficulty.  Past Medical History  Diagnosis Date  . Bronchitis   . Acid reflux disease   . Bronchitis   . GERD (gastroesophageal reflux disease)     History reviewed. No pertinent past surgical history.  History reviewed. No pertinent family history.  History  Substance Use Topics  . Smoking status: Current Everyday Smoker -- 1.0 packs/day    Types: Cigarettes  . Smokeless tobacco: Not on file  . Alcohol Use: Yes     social drinker      Review of Systems  Constitutional: Negative for fever and chills.  HENT: Negative for neck pain and neck stiffness.   Eyes: Negative for pain.  Respiratory: Negative for shortness of breath.   Cardiovascular: Positive for chest pain.  Gastrointestinal: Negative for abdominal pain.  Genitourinary: Negative for dysuria.  Musculoskeletal: Negative for back pain.  Skin: Negative for rash.  Neurological: Negative for headaches.  All other systems reviewed and are negative.    Allergies  Shellfish allergy  Home Medications  No current  outpatient prescriptions on file.  BP 118/75  Pulse 65  Temp(Src) 98.6 F (37 C) (Oral)  Resp 21  SpO2 97%  Physical Exam  Constitutional: He is oriented to person, place, and time. He appears well-developed and well-nourished.  HENT:  Head: Normocephalic and atraumatic.  Eyes: Conjunctivae and EOM are normal. Pupils are equal, round, and reactive to light.  Neck: Trachea normal. Neck supple. No thyromegaly present.  Cardiovascular: Normal rate, regular rhythm, S1 normal, S2 normal and normal pulses.     No systolic murmur is present   No diastolic murmur is present  Pulses:      Radial pulses are 2+ on the right side, and 2+ on the left side.  Pulmonary/Chest: Effort normal and breath sounds normal. He has no wheezes. He has no rhonchi. He has no rales.       Left-sided chest tenderness is reproducible with palpation and movement of his left arm. No rash or crepitus  Abdominal: Soft. Normal appearance and bowel sounds are normal. There is no tenderness. There is no CVA tenderness and negative Murphy's sign.  Musculoskeletal:       BLE:s Calves nontender, no cords or erythema, negative Homans sign  Neurological: He is alert and oriented to person, place, and time. He has normal strength. No cranial nerve deficit or sensory deficit. GCS eye subscore is 4. GCS verbal subscore is 5. GCS motor subscore is 6.  Skin: Skin is warm and dry. No rash noted. He is not diaphoretic.  Psychiatric: His speech is  normal.       Cooperative and appropriate    ED Course  Procedures (including critical care time)  Labs Reviewed - No data to display Dg Chest 2 View  04/10/2011  *RADIOLOGY REPORT*  Clinical Data: Left-sided chest pain  CHEST - 2 VIEW  Comparison: 02/27/2011  Findings: Mild peribronchial cuffing is similar to prior.  There is no focal consolidation, pleural effusion, or pneumothorax.  The cardiomediastinal contours are within normal limits.  No acute osseous abnormality.  IMPRESSION:  Mild peribronchial cuffing is similar to prior.  No focal consolidation.  Original Report Authenticated By: Waneta Martins, M.D.     Date: 04/10/2011  Rate: 59  Rhythm: normal sinus rhythm  QRS Axis: normal  Intervals: normal  ST/T Wave abnormalities: nonspecific ST changes  Conduction Disutrbances:none  Narrative Interpretation:   Old EKG Reviewed: unchanged   IV fluids and IV Dilaudid. Tolerates fluids by mouth  MDM   Reproducible left-sided chest pain with chest x-ray and EKG as above. Presentation does not suggest ACS, esophageal rupture, or pericarditis. Pain improved and stable for discharge home with chest pain precautions verbalized is understood.        Sunnie Nielsen, MD 04/10/11 [phone]

## 2011-04-10 NOTE — Discharge Instructions (Signed)
Chest Pain, Nonspecific  It is often hard to give a specific diagnosis for the cause of chest pain. There is always a chance that your pain could be related to something serious, like a heart attack or a blood clot in the lungs. You need to follow up with your caregiver for further evaluation. More lab tests or other studies such as X-rays, electrocardiography, stress testing, or cardiac imaging may be needed to find the cause of your pain.  Most of the time, nonspecific chest pain improves within 2 to 3 days with rest and mild pain medicine. For the next few days, avoid physical exertion or activities that bring on pain. Do not smoke. Avoid drinking alcohol. Call your caregiver for routine follow-up as advised.   SEEK IMMEDIATE MEDICAL CARE IF:   You develop increased chest pain or pain that radiates to the arm, neck, jaw, back, or abdomen.   You develop shortness of breath, increased coughing, or you start coughing up blood.   You have severe back or abdominal pain, nausea, or vomiting.   You develop severe weakness, fainting, fever, or chills.  Document Released: 01/17/2005 Document Revised: 01/06/2011 Document Reviewed: 07/07/2006  [employer] Patient Information 2012 [employer]

## 2011-06-20 ENCOUNTER — Emergency Department (HOSPITAL_COMMUNITY)
Admission: EM | Admit: 2011-06-20 | Discharge: 2011-06-21 | Disposition: A | Discharge status: Home / Self Care | Payer: Self-pay | Attending: Emergency Medicine | Admitting: Emergency Medicine

## 2011-06-20 ENCOUNTER — Encounter (HOSPITAL_COMMUNITY): Payer: Self-pay | Admitting: Emergency Medicine

## 2011-06-20 DIAGNOSIS — K219 Gastro-esophageal reflux disease without esophagitis: Secondary | ICD-10-CM | POA: Insufficient documentation

## 2011-06-20 DIAGNOSIS — R51 Headache: Secondary | ICD-10-CM

## 2011-06-20 DIAGNOSIS — J329 Chronic sinusitis, unspecified: Secondary | ICD-10-CM | POA: Insufficient documentation

## 2011-06-20 NOTE — ED Notes (Signed)
PT. REPORTS HEADACHE WITH NAUSEA AND GENERALIZED WEAKNESS FOR SEVERAL DAYS , DENIES INJURY.

## 2011-06-21 MED ORDER — KETOROLAC TROMETHAMINE 30 MG/ML IJ SOLN
30.0000 mg | Freq: Once | INTRAMUSCULAR | Status: AC
  Administered 2011-06-21: 30 mg via INTRAMUSCULAR
  Filled 2011-06-21: qty 1

## 2011-06-21 MED ORDER — PSEUDOEPHEDRINE HCL ER 120 MG PO TB12
120.0000 mg | ORAL_TABLET | Freq: Once | ORAL | Status: AC
  Administered 2011-06-21: 120 mg via ORAL
  Filled 2011-06-21: qty 1

## 2011-06-21 MED ORDER — PSEUDOEPHEDRINE HCL ER 120 MG PO TB12: 120.0000 mg | ORAL_TABLET | Freq: Once | ORAL | Status: DC

## 2011-06-21 NOTE — ED Provider Notes (Signed)
Medical screening examination/treatment/procedure(s) were performed by non-physician practitioner and as supervising physician I was immediately available for consultation/collaboration.    Vida Roller, MD 06/21/11 [phone]

## 2011-06-21 NOTE — Discharge Instructions (Signed)

## 2011-06-21 NOTE — ED Provider Notes (Signed)
History     CSN: [id number]  Arrival date & time 06/20/11  2316   First MD Initiated Contact with Patient 06/20/11 2350      Chief Complaint  Patient presents with  . Headache    (Consider location/radiation/quality/duration/timing/severity/associated sxs/prior treatment) HPI Comments: Patient with a history of seasonal allergies, now having right frontal sinus pain, as well as headache is not taking any over-the-counter medication because "he doesn't have any"  Patient is a 26 y.o. male presenting with headaches. The history is provided by the patient.  Headache  This is a new problem. The current episode started 2 days ago. The problem occurs constantly. The problem has not changed since onset.The pain is located in the frontal region. The quality of the pain is described as dull. The pain is at a severity of 5/10. The pain is moderate. The pain does not radiate. Pertinent negatives include no fever, no shortness of breath and no nausea.    Past Medical History  Diagnosis Date  . Bronchitis   . Acid reflux disease   . Bronchitis   . GERD (gastroesophageal reflux disease)     History reviewed. No pertinent past surgical history.  No family history on file.  History  Substance Use Topics  . Smoking status: Current Everyday Smoker -- 1.0 packs/day    Types: Cigarettes  . Smokeless tobacco: Not on file  . Alcohol Use: Yes     social drinker      Review of Systems  Constitutional: Negative for fever and chills.  HENT: Positive for congestion and sinus pressure.   Eyes: Negative for pain.  Respiratory: Negative for shortness of breath.   Gastrointestinal: Negative for nausea.  Neurological: Positive for headaches. Negative for dizziness.    Allergies  Shellfish allergy  Home Medications   Current Outpatient Rx  Name Route Sig Dispense Refill  . DIPHENHYDRAMINE HCL 25 MG PO CAPS Oral Take 1 capsule (25 mg total) by mouth every 6 (six) hours as needed for  itching. 30 capsule 0  . PSEUDOEPHEDRINE HCL ER 120 MG PO TB12 Oral Take 1 tablet (120 mg total) by mouth once. 10 tablet 0    BP 129/87  Pulse 66  Temp(Src) 98.1 F (36.7 C) (Oral)  Resp 20  SpO2 99%  Physical Exam  Constitutional: He is oriented to person, place, and time. He appears well-developed and well-nourished.  HENT:  Head: Normocephalic.  Nose: Right sinus exhibits maxillary sinus tenderness and frontal sinus tenderness.  Eyes: Pupils are equal, round, and reactive to light.  Neck: Normal range of motion.  Cardiovascular: Normal rate.   Pulmonary/Chest: Effort normal.  Musculoskeletal: Normal range of motion.  Neurological: He is alert and oriented to person, place, and time.  Skin: Skin is warm.    ED Course  Procedures (including critical care time)  Labs Reviewed - No data to display No results found.   1. Headache   2. Sinusitis     Headache has improved  MDM  Refill his headache is a result of his sinus congestion        Arman Filter, NP 06/21/11 0013  Arman Filter, NP 06/21/11 0148  Arman Filter, NP 06/21/11 0148

## 2011-06-24 ENCOUNTER — Emergency Department (HOSPITAL_COMMUNITY)
Admission: EM | Admit: 2011-06-24 | Discharge: 2011-06-24 | Disposition: A | Discharge status: Home Health | Payer: Self-pay | Attending: Emergency Medicine | Admitting: Emergency Medicine

## 2011-06-24 ENCOUNTER — Encounter (HOSPITAL_COMMUNITY): Payer: Self-pay | Admitting: *Deleted

## 2011-06-24 DIAGNOSIS — F17200 Nicotine dependence, unspecified, uncomplicated: Secondary | ICD-10-CM | POA: Insufficient documentation

## 2011-06-24 DIAGNOSIS — Z7289 Other problems related to lifestyle: Secondary | ICD-10-CM

## 2011-06-24 DIAGNOSIS — K219 Gastro-esophageal reflux disease without esophagitis: Secondary | ICD-10-CM | POA: Insufficient documentation

## 2011-06-24 DIAGNOSIS — F1010 Alcohol abuse, uncomplicated: Secondary | ICD-10-CM | POA: Insufficient documentation

## 2011-06-24 DIAGNOSIS — R109 Unspecified abdominal pain: Secondary | ICD-10-CM | POA: Insufficient documentation

## 2011-06-24 LAB — URINALYSIS, ROUTINE W REFLEX MICROSCOPIC
Glucose, UA: NEGATIVE mg/dL
Ketones, ur: NEGATIVE mg/dL
Leukocytes, UA: NEGATIVE
Nitrite: NEGATIVE
Protein, ur: NEGATIVE mg/dL

## 2011-06-24 LAB — POCT I-STAT, CHEM 8
BUN: 13 mg/dL (ref 6–23)
Calcium, Ion: 1.16 mmol/L (ref 1.12–1.32)
TCO2: 22 mmol/L (ref 0–100)

## 2011-06-24 MED ORDER — SODIUM CHLORIDE 0.9 % IV BOLUS (SEPSIS)
1000.0000 mL | Freq: Once | INTRAVENOUS | Status: AC
  Administered 2011-06-24: 1000 mL via INTRAVENOUS

## 2011-06-24 NOTE — ED Provider Notes (Signed)
History     CSN: [id number]  Arrival date & time 06/24/11  1310   First MD Initiated Contact with Patient 06/24/11 1337      Chief Complaint  Patient presents with  . Abdominal Pain    (Consider location/radiation/quality/duration/timing/severity/associated sxs/prior treatment) HPI Comments: Patient reports waking up this morning with lower abdominal pain, nausea.  Has had one episode of diarrhea without blood, and one episode of hematuria - both at the beginning and end of his stream.  Denies fevers, vomiting, penile discharge, urinary frequency or urgency.  Drank most of a bottle of vodka and two beers last night.    Patient is a 26 y.o. male presenting with abdominal pain. The history is provided by the patient.  Abdominal Pain The primary symptoms of the illness include abdominal pain, nausea and diarrhea. The primary symptoms of the illness do not include fever, vomiting or dysuria.  Additional symptoms associated with the illness include hematuria. Symptoms associated with the illness do not include chills, urgency or frequency.    Past Medical History  Diagnosis Date  . Bronchitis   . Acid reflux disease   . Bronchitis   . GERD (gastroesophageal reflux disease)     Past Surgical History  Procedure Date  . Tonsillectomy   . Adenoidectomy     No family history on file.  History  Substance Use Topics  . Smoking status: Current Everyday Smoker -- 1.0 packs/day    Types: Cigarettes  . Smokeless tobacco: Not on file  . Alcohol Use: 0.6 oz/week    1 Cans of beer per week     social drinker      Review of Systems  Constitutional: Negative for fever and chills.  Gastrointestinal: Positive for nausea, abdominal pain and diarrhea. Negative for vomiting and blood in stool.  Genitourinary: Positive for hematuria. Negative for dysuria, urgency, frequency, penile pain and testicular pain.    Allergies  Shellfish allergy  Home Medications   Current Outpatient Rx    Name Route Sig Dispense Refill  . PSEUDOEPHEDRINE HCL ER 120 MG PO TB12 Oral Take 1 tablet (120 mg total) by mouth once. 10 tablet 0    BP 138/76  Pulse 82  Temp(Src) 98.3 F (36.8 C) (Oral)  Resp 26  Ht 5\' 9"  (1.753 m)  Wt 15 lb 12.8 oz (7.167 kg)  BMI 2.33 kg/m2  SpO2 99%  Physical Exam  Nursing note and vitals reviewed. Constitutional: He is oriented to person, place, and time. He appears well-developed and well-nourished.  HENT:  Head: Normocephalic and atraumatic.  Neck: Neck supple.  Cardiovascular: Normal rate, regular rhythm and normal heart sounds.   Pulmonary/Chest: Breath sounds normal. No respiratory distress. He has no wheezes. He has no rales. He exhibits no tenderness.  Abdominal: Soft. Bowel sounds are normal. He exhibits no distension and no mass. There is generalized tenderness. There is no rebound and no guarding.  Neurological: He is alert and oriented to person, place, and time.    ED Course  Procedures (including critical care time)  Labs Reviewed  POCT I-STAT, CHEM 8 - Abnormal; Notable for the following:    Glucose, Bld 101 (*)    All other components within normal limits  URINALYSIS, ROUTINE W REFLEX MICROSCOPIC   No results found.  Patient seen and examined, declines any medication at this time "because of all the alcohol in my system."  4:14 PM Patient reports he is feeling much better, denies any abdominal pain.  States he  is hungry (has already eaten one sandwich).  UA pending.    1. Alcohol use   2. Abdominal pain       MDM  Patient presented to ED after a night of heavy drinking complaining of one episode of hematuria, also with abdominal pain, nausea, diarrhea.  Pt given IVF in ED, and food, with great improvement.  UA unremarkable, did not show any blood. Discussed results with patient.  I have given him PCP resources for follow up and urology follow up in the event that he continues to have gross hematuria in the future.  Patient  verbalizes understanding and agrees with plan.          Dillard Cannon [place], [place] 06/24/11 [phone]

## 2011-06-24 NOTE — ED Notes (Signed)
Pt states "I thought I had alcohol poisoning, my stomach hurts, it looks like I have blood in my urine"

## 2011-06-24 NOTE — Discharge Instructions (Signed)
Read the information below.  There was no blood in your urine while you were in the Emergency Department.  If you continue to see blood in your urine, please call the urologist below for a follow up appointment.  Drink plenty of fluids over the next few days.  Use the resources below to find a primary care provider for follow up. You may return to the ER at any time for worsening condition or any new symptoms that concern you.   If you have no primary doctor, here are some resources that may be helpful:  Medicaid-accepting [facility] Providers:   - [facility]- [address]      [phone]      Mon-Fri 9am-7pm, Sat 9am-1pm   - [facility]- [address] [place], [place]      [phone]   - [facility]- [address], [address] [place]      [phone]   [facility]- [address]      [phone]   - Renaye Rakers- [address] [place], [address]      [phone]      Only accepts [place] patients       after they have her name applied to their card   Self Pay (no insurance) in [place]:   - Sickle Cell Patients: Dr Willey Blade, [facility]      [address] [place]      [phone]   - Health [phone]   - Physician Referral Service- [phone]   - [facility]- [address] [place]      [phone]   [facility]- [address]   - [facility]- see information above      (Speak to [employer] if you do not have insurance)   - [facility]- [address] [place]      [phone]   - [facility] [place]- [address]      [phone]   - [facility]- [address]      [phone]   - Dr Julio Sicks-  [address], [place]      [phone]   - [facility]- [address]      [phone]   - [facility]- [address]      [phone]      Also [address]      [phone]   - [facility]- [address]      [phone]      1st and 3rd Saturday every month, 10am-1pm Other agencies that provide inexpensive medical care:    [facility]  [phone]    [facility]  [phone]    [facility]  [phone]    [facility]  [phone]    [facility]  [phone]  General Information: Finding a doctor when you do not have health insurance can be tricky. Although you are not limited by an insurance plan, you are of course limited by her finances and how much but he can pay out of pocket.  What are your options if you don't have health insurance?   1) Find a Doctor and Pay Out of Pocket Although you won't have to find out who is covered  by your insurance plan, it is a good idea to ask around and get recommendations. You will then need to call the office and see if the doctor you have chosen will accept you as a new patient and what types of options they offer for patients who are self-pay. Some doctors offer discounts or will set up payment plans for their patients who do not have insurance, but you will need to ask so you aren't surprised when you get to your appointment.  2) Contact Your Local Health Department Not all health departments have doctors that can see patients for sick visits, but many do, so it is worth a call to see if yours does. If you don't know where your local health department is, you can check in your phone book. The CDC also has a tool to help you locate your state's health department, and many state websites also have listings of all of their local health departments.  3) Find a Walk-in Clinic If your illness is not likely to be very severe or complicated, you may want to try a walk in clinic. These are popping up all over the country in pharmacies, drugstores, and shopping centers. They're usually staffed by nurse practitioners or physician assistants that  have been trained to treat common illnesses and complaints. They're usually fairly quick and inexpensive. However, if you have serious medical issues or chronic medical problems, these are probably not your best option

## 2011-06-25 NOTE — ED Provider Notes (Signed)
Medical screening examination/treatment/procedure(s) were performed by non-physician practitioner and as supervising physician I was immediately available for consultation/collaboration.   Celene Kras, MD 06/25/11 [phone]

## 2011-06-29 ENCOUNTER — Emergency Department (HOSPITAL_COMMUNITY)
Admission: EM | Admit: 2011-06-29 | Discharge: 2011-06-29 | Disposition: A | Discharge status: Home / Self Care | Payer: Self-pay | Attending: Emergency Medicine | Admitting: Emergency Medicine

## 2011-06-29 ENCOUNTER — Encounter (HOSPITAL_COMMUNITY): Payer: Self-pay | Admitting: Emergency Medicine

## 2011-06-29 DIAGNOSIS — J069 Acute upper respiratory infection, unspecified: Secondary | ICD-10-CM | POA: Insufficient documentation

## 2011-06-29 DIAGNOSIS — F17200 Nicotine dependence, unspecified, uncomplicated: Secondary | ICD-10-CM | POA: Insufficient documentation

## 2011-06-29 DIAGNOSIS — K219 Gastro-esophageal reflux disease without esophagitis: Secondary | ICD-10-CM | POA: Insufficient documentation

## 2011-06-29 MED ORDER — CETIRIZINE HCL 10 MG PO TABS: 10.0000 mg | ORAL_TABLET | Freq: Every day | ORAL | Status: DC

## 2011-06-29 NOTE — Discharge Instructions (Signed)
Viral Infections A viral infection can be caused by different types of viruses.Most viral infections are not serious and resolve on their own. However, some infections may cause severe symptoms and may lead to further complications. SYMPTOMS Viruses can frequently cause:  Minor sore throat.   Aches and pains.   Headaches.   Runny nose.   Different types of rashes.   Watery eyes.   Tiredness.   Cough.   Loss of appetite.   Gastrointestinal infections, resulting in nausea, vomiting, and diarrhea.  These symptoms do not respond to antibiotics because the infection is not caused by bacteria. However, you might catch a bacterial infection following the viral infection. This is sometimes called a "superinfection." Symptoms of such a bacterial infection may include:  Worsening sore throat with pus and difficulty swallowing.   Swollen neck glands.   Chills and a high or persistent fever.   Severe headache.   Tenderness over the sinuses.   Persistent overall ill feeling (malaise), muscle aches, and tiredness (fatigue).   Persistent cough.   Yellow, green, or brown mucus production with coughing.  HOME CARE INSTRUCTIONS   Only take over-the-counter or prescription medicines for pain, discomfort, diarrhea, or fever as directed by your caregiver.   Drink enough water and fluids to keep your urine clear or pale yellow. Sports drinks can provide valuable electrolytes, sugars, and hydration.   Get plenty of rest and maintain proper nutrition. Soups and broths with crackers or rice are fine.  SEEK IMMEDIATE MEDICAL CARE IF:   You have severe headaches, shortness of breath, chest pain, neck pain, or an unusual rash.   You have uncontrolled vomiting, diarrhea, or you are unable to keep down fluids.   You or your child has an oral temperature above 102 F (38.9 C), not controlled by medicine.   Your baby is older than 3 months with a rectal temperature of 102 F (38.9 C) or  higher.   Your baby is 68 months old or younger with a rectal temperature of 100.4 F (38 C) or higher.  MAKE SURE YOU:   Understand these instructions.   Will watch your condition.   Will get help right away if you are not doing well or get worse.  Document Released: 10/27/2004 Document Revised: 01/06/2011 Document Reviewed: 05/24/2010 [facility] Patient Information 2012 [place], [place].           Antibiotic Nonuse  Your caregiver felt that the infection or problem was not one that would be helped with an antibiotic. Infections may be caused by viruses or bacteria. Only a caregiver can tell which one of these is the likely cause of an illness. A cold is the most common cause of infection in both adults and children. A cold is a virus. Antibiotic treatment will have no effect on a viral infection. Viruses can lead to many lost days of work caring for sick children and many missed days of school. Children may catch as many as 10 "colds" or "flus" per year during which they can be tearful, cranky, and uncomfortable. The goal of treating a virus is aimed at keeping the ill person comfortable. Antibiotics are medications used to help the body fight bacterial infections. There are relatively few types of bacteria that cause infections but there are hundreds of viruses. While both viruses and bacteria cause infection they are very different types of germs. A viral infection will typically go away by itself within 7 to 10 days. Bacterial infections may spread or get worse  without antibiotic treatment. Examples of bacterial infections are:  Sore throats (like strep throat or tonsillitis).   Infection in the lung (pneumonia).   Ear and skin infections.  Examples of viral infections are:  Colds or flus.   Most coughs and bronchitis.   Sore throats not caused by Strep.   Runny noses.  It is often best not to take an antibiotic when a viral infection is the cause of the problem.  Antibiotics can kill off the helpful bacteria that we have inside our body and allow harmful bacteria to start growing. Antibiotics can cause side effects such as allergies, nausea, and diarrhea without helping to improve the symptoms of the viral infection. Additionally, repeated uses of antibiotics can cause bacteria inside of our body to become resistant. That resistance can be passed onto harmful bacterial. The next time you have an infection it may be harder to treat if antibiotics are used when they are not needed. Not treating with antibiotics allows our own immune system to develop and take care of infections more efficiently. Also, antibiotics will work better for [place] when they are prescribed for bacterial infections. Treatments for a child that is ill may include:  Give extra fluids throughout the day to stay hydrated.   Get plenty of rest.   Only give your child over-the-counter or prescription medicines for pain, discomfort, or fever as directed by your caregiver.   The use of a cool mist humidifier may help stuffy noses.   Cold medications if suggested by your caregiver.  Your caregiver may decide to start you on an antibiotic if:  The problem you were seen for today continues for a longer length of time than expected.   You develop a secondary bacterial infection.  SEEK MEDICAL CARE IF:  Fever lasts longer than 5 days.   Symptoms continue to get worse after 5 to 7 days or become severe.   Difficulty in breathing develops.   Signs of dehydration develop (poor drinking, rare urinating, dark colored urine).   Changes in behavior or worsening tiredness (listlessness or lethargy).  Document Released: 03/28/2001 Document Revised: 01/06/2011 Document Reviewed: 09/24/2008 [facility] Patient Information 2012 [place], [place].          Saline Nose Drops  To help clear a stuffy nose, put salt water (saline) nose drops in your infant's nose. This helps to loosen the secretions  in the nose. Use a bulb syringe to clean the nose out:  Before feeding.   Before putting your infant down for naps.   No more than once every 3 hours to avoid irritating your infant's nostrils.  HOME CARE  Buy nose drops at your local drug store. You can also make nose drops yourself. Mix 1 cup of water with  teaspoon of salt. Stir. Store this mixture at room temperature. Make a new batch daily.   To use the drops:   Put 1 or 2 drops in each side of infant's nose with a clean medicine dropper. Do not use this dropper for any other medicine.   Squeeze the air out of the suction bulb before inserting it into your infant's nose.   While still squeezing the bulb flat, place the tip of the bulb into a nostril. Let air come back into the bulb. The suction will pull snot out of the nose and into the bulb.   Repeat on other nostril.   Squeeze the bulb several times into a tissue and wash the bulb tip in soapy water.  Store the bulb with the tip side down on paper towel.   Use the bulb syringe with only the saline drops to avoid irritating your infant's nostrils.  GET HELP RIGHT AWAY IF:  The snot changes to green or yellow.   The snot gets thicker.   Your infant is 3 months or younger with a rectal temperature of 100.4 F (38 C) or higher.   Your infant is older than 3 months with a rectal temperature of 102 F (38.9 C) or higher.   The stuffy nose lasts 10 days or longer.   There is trouble breathing or feeding.  MAKE SURE YOU:  Understand these instructions.   Will watch your infant's condition.   Will get help right away if your infant is not doing well or gets worse.  Document Released: 11/14/2008 Document Revised: 01/06/2011 Document Reviewed: 11/14/2008 [facility] Patient Information 2012 [place], [place].

## 2011-06-29 NOTE — ED Notes (Signed)
Patient with cold symptoms and generalized body aches.  Patient was told to take sudafed, which has been making him sick.  Patient continues with congestion and body aches.

## 2011-06-29 NOTE — ED Provider Notes (Signed)
History     CSN: [id number]  Arrival date & time 06/29/11  2039   First MD Initiated Contact with Patient 06/29/11 2211      Chief Complaint  Patient presents with  . Fever  . Generalized Body Aches    (Consider location/radiation/quality/duration/timing/severity/associated sxs/prior treatment) Patient is a 26 y.o. male presenting with URI. The history is provided by the patient.  URI The primary symptoms include fever (tmax 101 F), ear pain, sore throat, cough and myalgias. Primary symptoms do not include headaches, swollen glands, wheezing, abdominal pain, nausea, vomiting or rash. The current episode started 3 to 5 days ago. This is a recurrent problem. The problem has not changed since onset. The sore throat is not accompanied by trouble swallowing.  Symptoms associated with the illness include sinus pressure, congestion and rhinorrhea. The illness is not associated with chills. The following treatments were addressed: Acetaminophen was not tried. A decongestant was ineffective. Aspirin was not tried. NSAIDs were not tried.    Past Medical History  Diagnosis Date  . Bronchitis   . Acid reflux disease   . Bronchitis   . GERD (gastroesophageal reflux disease)     Past Surgical History  Procedure Date  . Tonsillectomy   . Adenoidectomy     No family history on file.  History  Substance Use Topics  . Smoking status: Current Everyday Smoker -- 1.0 packs/day    Types: Cigarettes  . Smokeless tobacco: Not on file  . Alcohol Use: 0.6 oz/week    1 Cans of beer per week     social drinker      Review of Systems  Constitutional: Positive for fever (tmax 101 F). Negative for chills.  HENT: Positive for ear pain, congestion, sore throat, rhinorrhea and sinus pressure. Negative for hearing loss, trouble swallowing, neck pain, neck stiffness and voice change.   Eyes: Negative for pain and visual disturbance.  Respiratory: Positive for cough. Negative for shortness of  breath and wheezing.   Cardiovascular: Negative for chest pain.  Gastrointestinal: Negative for nausea, vomiting and abdominal pain.  Musculoskeletal: Positive for myalgias.  Skin: Negative for rash.  Neurological: Negative for syncope and headaches.  Psychiatric/Behavioral: Negative for confusion.    Allergies  Shellfish allergy and Sudafed  Home Medications   Current Outpatient Rx  Name Route Sig Dispense Refill  . PSEUDOEPHEDRINE HCL ER 120 MG PO TB12 Oral Take 120 mg by mouth every 12 (twelve) hours.      BP 113/71  Pulse 73  Temp(Src) 98.2 F (36.8 C) (Oral)  Resp 16  SpO2 100%  Physical Exam  Nursing note reviewed. Constitutional: He appears well-developed and well-nourished. No distress.       Vital signs are reviewed and are normal.   HENT:  Head: Normocephalic and atraumatic. No trismus in the jaw.  Right Ear: Tympanic membrane, external ear and ear canal normal.  Left Ear: Tympanic membrane, external ear and ear canal normal.  Nose: Mucosal edema and rhinorrhea present. Right sinus exhibits no maxillary sinus tenderness and no frontal sinus tenderness. Left sinus exhibits no maxillary sinus tenderness and no frontal sinus tenderness.  Mouth/Throat: Mucous membranes are normal. Uvula swelling present. Posterior oropharyngeal erythema present. No oropharyngeal exudate or posterior oropharyngeal edema.  Eyes: Conjunctivae are normal. Pupils are equal, round, and reactive to light.  Neck: Neck supple.  Cardiovascular: Normal rate, regular rhythm and normal heart sounds.   No murmur heard. Pulmonary/Chest: Effort normal and breath sounds normal. No respiratory distress. He  has no wheezes. He has no rales. He exhibits no tenderness.  Abdominal: Soft. Bowel sounds are normal. He exhibits no distension. There is no tenderness.  Lymphadenopathy:    He has no cervical adenopathy.  Skin: Skin is warm and dry.  Psychiatric: He has a normal mood and affect.    ED Course    Procedures (including critical care time)  Labs Reviewed - No data to display No results found.  Dx 1: URI   MDM  URI sx, ?seasonal allergy component given history. No fever in ED, no SOB to suggest pneumonia. No exudate, +cough makes strep pharyngitis less likely. Non-toxic appearing pt. Advised to begin taking daily allergy rx and preferred otc medication for symptoms otherwise. Pt voices understanding.        Shaaron Adler, [place] 06/29/11 2306

## 2011-06-29 NOTE — ED Notes (Signed)
D/c instructions reviewed w/ pt - pt denies any further questions or concerns at present. Pt ambulating independently w/ steady gait on d/c in no acute distress, A&Ox4.  

## 2011-06-29 NOTE — ED Notes (Signed)
Pt reports productive cough, nasal drainage, sore throat and fever x3 days - pt in no acute distress on assessment - A&Ox4, resp even and unlabored.

## 2011-06-30 NOTE — ED Provider Notes (Signed)
Medical screening examination/treatment/procedure(s) were performed by non-physician practitioner and as supervising physician I was immediately available for consultation/collaboration.   Bryona Foxworthy, MD 06/30/11 0019 

## 2011-09-28 ENCOUNTER — Emergency Department (INDEPENDENT_AMBULATORY_CARE_PROVIDER_SITE_OTHER): Payer: Self-pay

## 2011-09-28 ENCOUNTER — Encounter (HOSPITAL_COMMUNITY): Payer: Self-pay | Admitting: *Deleted

## 2011-09-28 ENCOUNTER — Emergency Department (INDEPENDENT_AMBULATORY_CARE_PROVIDER_SITE_OTHER)
Admission: EM | Admit: 2011-09-28 | Discharge: 2011-09-28 | Disposition: A | Discharge status: Home / Self Care | Payer: Self-pay | Source: Home / Self Care | Attending: Emergency Medicine | Admitting: Emergency Medicine

## 2011-09-28 DIAGNOSIS — J40 Bronchitis, not specified as acute or chronic: Secondary | ICD-10-CM

## 2011-09-28 HISTORY — DX: Pneumonia, unspecified organism: J18.9

## 2011-09-28 HISTORY — DX: Other seasonal allergic rhinitis: J30.2

## 2011-09-28 MED ORDER — IBUPROFEN 600 MG PO TABS: 600.0000 mg | ORAL_TABLET | Freq: Four times a day (QID) | ORAL | Status: AC | PRN

## 2011-09-28 MED ORDER — HYDROCODONE-ACETAMINOPHEN 7.5-500 MG/15ML PO SOLN: 5.0000 mL | Freq: Four times a day (QID) | ORAL | Status: AC | PRN

## 2011-09-28 MED ORDER — ALBUTEROL SULFATE HFA 108 (90 BASE) MCG/ACT IN AERS: 1.0000 | INHALATION_SPRAY | Freq: Four times a day (QID) | RESPIRATORY_TRACT | Status: DC | PRN

## 2011-09-28 MED ORDER — DEXAMETHASONE 4 MG PO TABS: ORAL_TABLET | ORAL | Status: AC

## 2011-09-28 NOTE — ED Notes (Signed)
Onset 1 week ago headache, runny rose, then wheezing, productive cough and weakness.  He has had a fever at home, but has not checked it

## 2011-09-28 NOTE — ED Provider Notes (Signed)
History     CSN: [id number]  Arrival date & time 09/28/11  1136   First MD Initiated Contact with Patient 09/28/11 1248      Chief Complaint  Patient presents with  . Cough    (Consider location/radiation/quality/duration/timing/severity/associated sxs/prior treatment) HPI Comments: Patient reports rhinorrhea, frontal sinus pain/pressure, itchy, irritated throat starting one week ago. Has been taking Sudafed, DayQuil cold and flu for this with temporary improvement. States that this is now largely resolved, but is now "moved into his chest". Reports cough productive of whitish sputum, noted some streaks of blood yesterday after a strong coughing spell. No hemoptysis. Reports wheezing, shortness of breath, fatigue. No nausea, vomiting. No documented fevers at home, although the patient reports chills and feeling feverish. No abdominal pain. He is a smoker. Patient history seasonal allergies, but states that they usually bother him at the beginning of summer and not this time in year.  ROS as noted in HPI. All other ROS negative.   Patient is a 26 y.o. male presenting with cough. The history is provided by the patient. No language interpreter was used.  Cough This is a new problem. The current episode started 2 days ago. The problem occurs every few hours. The problem has not changed since onset.The cough is productive of sputum. There has been no fever. Associated symptoms include chills, sweats, headaches, rhinorrhea, myalgias, shortness of breath and wheezing. Pertinent negatives include no chest pain, no weight loss, no ear congestion and no sore throat. He has tried decongestants for the symptoms. The treatment provided mild relief. He is a smoker. His past medical history is significant for bronchitis and pneumonia.    Past Medical History  Diagnosis Date  . Bronchitis   . Acid reflux disease   . Bronchitis   . GERD (gastroesophageal reflux disease)   . Seasonal allergies   .  Pneumonia     Past Surgical History  Procedure Date  . Tonsillectomy   . Adenoidectomy     Family History  Problem Relation Age of Onset  . Diabetes Mother   . Diabetes Father     History  Substance Use Topics  . Smoking status: Current Everyday Smoker -- 1.0 packs/day    Types: Cigarettes  . Smokeless tobacco: Not on file  . Alcohol Use: 0.6 oz/week    1 Cans of beer per week     social drinker      Review of Systems  Constitutional: Positive for chills. Negative for weight loss.  HENT: Positive for rhinorrhea. Negative for sore throat.   Respiratory: Positive for cough, shortness of breath and wheezing.   Cardiovascular: Negative for chest pain.  Musculoskeletal: Positive for myalgias.  Neurological: Positive for headaches.    Allergies  Mucinex and Shellfish allergy  Home Medications   Current Outpatient Rx  Name Route Sig Dispense Refill  . ALBUTEROL SULFATE HFA 108 (90 BASE) MCG/ACT IN AERS Inhalation Inhale 1-2 puffs into the lungs every 6 (six) hours as needed for wheezing. 1 Inhaler 0  . DEXAMETHASONE 4 MG PO TABS  4 tabs (16 mg) po at once on day one, 4 tabs (16 mg) po at once on day 2 8 tablet 0  . HYDROCODONE-ACETAMINOPHEN 7.5-500 MG/15ML PO SOLN Oral Take 5 mLs by mouth every 6 (six) hours as needed for pain. 120 mL 0  . IBUPROFEN 600 MG PO TABS Oral Take 1 tablet (600 mg total) by mouth every 6 (six) hours as needed for pain. 30 tablet 0  BP 109/77  Pulse 60  Temp 98.5 F (36.9 C) (Oral)  Resp 16  SpO2 100%  Physical Exam  Nursing note and vitals reviewed. Constitutional: He is oriented to person, place, and time. He appears well-developed and well-nourished.  HENT:  Head: Normocephalic and atraumatic.  Right Ear: Tympanic membrane normal.  Left Ear: Tympanic membrane normal.  Nose: Rhinorrhea present. No mucosal edema.  Mouth/Throat: Uvula is midline and mucous membranes are normal. Posterior oropharyngeal erythema present.        Tonsils surgically absent  Eyes: Conjunctivae and EOM are normal.  Neck: Normal range of motion. Neck supple.  Cardiovascular: Normal rate, regular rhythm and normal heart sounds.   Pulmonary/Chest: Effort normal. No respiratory distress. He has no wheezes.       Occasional rhonchi right lower lobe, clears with coughing.  Abdominal: He exhibits no distension.  Musculoskeletal: Normal range of motion.  Neurological: He is alert and oriented to person, place, and time. Coordination normal.  Skin: Skin is warm and dry.  Psychiatric: He has a normal mood and affect. His behavior is normal. Judgment and thought content normal.    ED Course  Procedures (including critical care time)  Labs Reviewed - No data to display Dg Chest 2 View  09/28/2011  *RADIOLOGY REPORT*  Clinical Data: Cough for 1 week  CHEST - 2 VIEW  Comparison: Chest radiograph 04/10/2011  Findings: Normal mediastinum and heart silhouette.  Costophrenic angles are clear.  No effusion, infiltrate, or pneumothorax.  IMPRESSION: No acute cardiopulmonary process.   Original Report Authenticated By: Genevive Bi, M.D.      1. Bronchitis    Imaging reviewed by myself. No pna. Report per radiologist.  MDM  Checking chest x-ray to rule out pneumonia, as patient does have some rhonchi at the bases right lower lobe, clear with coughing. No documented fever here or at home, satting well, no respiratory distress. If chest x-ray is negative for pneumonia, we'll treat as bronchitis with albuterol, steroids, cough syrup. Discussed the symptoms that should prompt his return to the department. Counseled pt on  smoking cessation. Discussed x-ray, MDM, plan, and signs and symptoms that should prompt his return to the department. Patient agrees with plan.  Luiz Blare, MD 09/28/11 [phone]

## 2011-12-21 ENCOUNTER — Emergency Department (HOSPITAL_COMMUNITY)
Admission: EM | Admit: 2011-12-21 | Discharge: 2011-12-21 | Disposition: A | Discharge status: Home / Self Care | Payer: Self-pay | Attending: Emergency Medicine | Admitting: Emergency Medicine

## 2011-12-21 ENCOUNTER — Encounter (HOSPITAL_COMMUNITY): Payer: Self-pay | Admitting: Emergency Medicine

## 2011-12-21 DIAGNOSIS — Z8701 Personal history of pneumonia (recurrent): Secondary | ICD-10-CM | POA: Insufficient documentation

## 2011-12-21 DIAGNOSIS — R197 Diarrhea, unspecified: Secondary | ICD-10-CM

## 2011-12-21 DIAGNOSIS — Z79899 Other long term (current) drug therapy: Secondary | ICD-10-CM | POA: Insufficient documentation

## 2011-12-21 DIAGNOSIS — F17200 Nicotine dependence, unspecified, uncomplicated: Secondary | ICD-10-CM | POA: Insufficient documentation

## 2011-12-21 DIAGNOSIS — Z8709 Personal history of other diseases of the respiratory system: Secondary | ICD-10-CM | POA: Insufficient documentation

## 2011-12-21 DIAGNOSIS — F10929 Alcohol use, unspecified with intoxication, unspecified: Secondary | ICD-10-CM

## 2011-12-21 DIAGNOSIS — R110 Nausea: Secondary | ICD-10-CM | POA: Insufficient documentation

## 2011-12-21 DIAGNOSIS — F10229 Alcohol dependence with intoxication, unspecified: Secondary | ICD-10-CM | POA: Insufficient documentation

## 2011-12-21 DIAGNOSIS — Z8719 Personal history of other diseases of the digestive system: Secondary | ICD-10-CM | POA: Insufficient documentation

## 2011-12-21 DIAGNOSIS — R109 Unspecified abdominal pain: Secondary | ICD-10-CM

## 2011-12-21 LAB — COMPREHENSIVE METABOLIC PANEL
Albumin: 3.6 g/dL (ref 3.5–5.2)
Alkaline Phosphatase: 71 U/L (ref 39–117)
BUN: 11 mg/dL (ref 6–23)
CO2: 23 mEq/L (ref 19–32)
Chloride: 102 mEq/L (ref 96–112)
GFR calc non Af Amer: >90 mL/min (ref >90)
Potassium: 3.9 mEq/L (ref 3.5–5.1)
Total Bilirubin: 0.3 mg/dL (ref 0.3–1.2)

## 2011-12-21 LAB — CBC
HCT: 39.5 % (ref 39.0–52.0)
Hemoglobin: 13.2 g/dL (ref 13.0–17.0)
MCV: 69.5 fL — ABNORMAL LOW (ref 78.0–100.0)
RBC: 5.68 MIL/uL (ref 4.22–5.81)
RDW: 15.1 % (ref 11.5–15.5)
WBC: 5.2 10*3/uL (ref 4.0–10.5)

## 2011-12-21 LAB — LIPASE, BLOOD: Lipase: 22 U/L (ref 11–59)

## 2011-12-21 LAB — ETHANOL: Alcohol, Ethyl (B): <11 mg/dL (ref 0–11)

## 2011-12-21 LAB — RAPID URINE DRUG SCREEN, HOSP PERFORMED
Amphetamines: NOT DETECTED
Tetrahydrocannabinol: NOT DETECTED

## 2011-12-21 MED ORDER — SODIUM CHLORIDE 0.9 % IV BOLUS (SEPSIS)
1000.0000 mL | Freq: Once | INTRAVENOUS | Status: AC
  Administered 2011-12-21: 1000 mL via INTRAVENOUS

## 2011-12-21 MED ORDER — GI COCKTAIL ~~LOC~~
30.0000 mL | Freq: Once | ORAL | Status: AC
  Administered 2011-12-21: 30 mL via ORAL
  Filled 2011-12-21: qty 30

## 2011-12-21 MED ORDER — ESOMEPRAZOLE MAGNESIUM 40 MG PO CPDR: 40.0000 mg | DELAYED_RELEASE_CAPSULE | Freq: Every day | ORAL | Status: DC

## 2011-12-21 MED ORDER — PANTOPRAZOLE SODIUM 40 MG IV SOLR
40.0000 mg | Freq: Once | INTRAVENOUS | Status: AC
  Administered 2011-12-21: 40 mg via INTRAVENOUS
  Filled 2011-12-21: qty 40

## 2011-12-21 MED ORDER — FAMOTIDINE 20 MG PO TABS
20.0000 mg | ORAL_TABLET | Freq: Once | ORAL | Status: AC
  Administered 2011-12-21: 20 mg via ORAL
  Filled 2011-12-21: qty 1

## 2011-12-21 MED ORDER — SODIUM CHLORIDE 0.9 % IV SOLN
Freq: Once | INTRAVENOUS | Status: AC
  Administered 2011-12-21: 07:00:00 via INTRAVENOUS

## 2011-12-21 NOTE — ED Provider Notes (Signed)
History     CSN: [id number]  Arrival date & time 12/21/11  4259   First MD Initiated Contact with Patient 12/21/11 0630      Chief Complaint  Patient presents with  . Abdominal Pain  . Alcohol Intoxication    (Consider location/radiation/quality/duration/timing/severity/associated sxs/prior treatment) HPI Comments: Anthony Pruitt 26 y.o. male   The chief complaint is: Patient presents with:   Abdominal Pain   Alcohol Intoxication    26 year old male presents with chief complaint of abdominal pain and diarrhea.  Patient states he "went over ward" with drinking.  Drank one half bottle of vodka without eating.  Patient states he developed epigastric abdominal pain with diarrhea.  No vomiting.  States that he does not usually drink.  Last drink was one week ago claims to have had one beer at that time.  8 a [place] seen which and was able to hold it down.  He has a history of GERD.  Denies fevers, chills, myalgias, arthralgias. Denies DOE, SOB, chest tightness or pressure, radiation to left arm, jaw or back, or diaphoresis. Denies dysuria, flank pain, suprapubic pain, frequency, urgency, or hematuria. Denies headaches, light headedness, weakness, visual disturbances. Denies abdominal pain, nausea, vomiting, diarrhea or constipation.      The history is provided by the patient and a friend. No language interpreter was used.    Past Medical History  Diagnosis Date  . Bronchitis   . Acid reflux disease   . Bronchitis   . GERD (gastroesophageal reflux disease)   . Seasonal allergies   . Pneumonia     Past Surgical History  Procedure Date  . Tonsillectomy   . Adenoidectomy     Family History  Problem Relation Age of Onset  . Diabetes Mother   . Diabetes Father     History  Substance Use Topics  . Smoking status: Current Every Day Smoker -- 1.0 packs/day    Types: Cigarettes  . Smokeless tobacco: Not on file  . Alcohol Use: 0.6 oz/week    1 Cans of beer per  week     Comment: social drinker      Review of Systems  Constitutional: Negative.   HENT: Negative.   Eyes: Negative.   Respiratory: Negative.   Cardiovascular: Negative.   Gastrointestinal: Positive for nausea, abdominal pain and diarrhea. Negative for vomiting, constipation, blood in stool, abdominal distention, anal bleeding and rectal pain.  Genitourinary: Negative.   Musculoskeletal: Negative.   Skin: Negative.   Neurological: Negative.   Psychiatric/Behavioral: Negative.   All other systems reviewed and are negative.    Allergies  Mucinex and Shellfish allergy  Home Medications   Current Outpatient Rx  Name  Route  Sig  Dispense  Refill  . ALBUTEROL SULFATE HFA 108 (90 BASE) MCG/ACT IN AERS   Inhalation   Inhale 1-2 puffs into the lungs every 6 (six) hours as needed for wheezing.   1 Inhaler   0     BP 117/78  Temp 97.8 F (36.6 C) (Oral)  Resp 23  SpO2 99%  Physical Exam  Nursing note and vitals reviewed. Constitutional: He is oriented to person, place, and time. He appears well-developed and well-nourished. No distress.  HENT:  Head: Normocephalic and atraumatic.  Eyes: Conjunctivae normal and EOM are normal. Pupils are equal, round, and reactive to light. No scleral icterus.  Neck: Normal range of motion. Neck supple.  Cardiovascular: Normal rate, regular rhythm and normal heart sounds.   Pulmonary/Chest: Effort normal and  breath sounds normal. No respiratory distress.  Abdominal: Soft. Bowel sounds are normal. There is tenderness (TTP epigastrium, R/LUQ).  Musculoskeletal: He exhibits no edema.  Neurological: He is alert and oriented to person, place, and time.       Speech is clear and goal oriented, follows commands No nystagmus Moves extremities without ataxia, coordination intact Normal finger to nose and rapid alternating movements     Skin: Skin is warm and dry. He is not diaphoretic.  Psychiatric: His behavior is normal.    ED Course   Procedures (including critical care time)  Labs Reviewed  CBC - Abnormal; Notable for the following:    MCV 69.5 (*)     MCH 23.2 (*)     All other components within normal limits  COMPREHENSIVE METABOLIC PANEL - Abnormal; Notable for the following:    Glucose, Bld 101 (*)     All other components within normal limits  ETHANOL  URINE RAPID DRUG SCREEN (HOSP PERFORMED)  LIPASE, BLOOD   No results found.   No diagnosis found.    MDM  7:49 AM  Patient states that he is feeling much better.  Ate [place] sandwhich without N/V.  A&O.  Still having pain, greatly diminshed since onset.  Labs unremarkable so far. Awaiting urine drug screen, ETOH, and lipase.     8:49 AM Patient states sxs resolved.   CV: RRR, No M/R/G, Peripheral pulses intact. No peripheral edema. Lungs: CTAB Abd: Soft, Non tender, non distended  Will d.c w/ rx for nexium   Arthor Captain, PA-C 12/21/11 1610

## 2011-12-21 NOTE — ED Notes (Signed)
Pt discharged with friend; pt given and explained all discharge instructions, stated understanding, denied questions/concerns; pt stable at time of discharge

## 2011-12-21 NOTE — ED Notes (Signed)
Brought in by EMS from home with c/o abdominal pain.  Per EMS, pt is also "intoxicated".  Per report, pt had been drinking "half a liter of vodka" and has had not "eaten anything".  Pt presents to ED drowsy.

## 2011-12-21 NOTE — ED Notes (Signed)
VHQ:IO96<EX> Expected date:<BR> Expected time:<BR> Means of arrival:<BR> Comments:<BR> Medic 211, 25 M, Abd Pain/ ETOH

## 2011-12-21 NOTE — ED Provider Notes (Signed)
Medical screening examination/treatment/procedure(s) were performed by non-physician practitioner and as supervising physician I was immediately available for consultation/collaboration.  Tobin Chad, MD 12/21/11 [phone]

## 2012-02-13 ENCOUNTER — Emergency Department (INDEPENDENT_AMBULATORY_CARE_PROVIDER_SITE_OTHER)
Admission: EM | Admit: 2012-02-13 | Discharge: 2012-02-13 | Disposition: A | Discharge status: Home / Self Care | Payer: Self-pay | Source: Home / Self Care

## 2012-02-13 ENCOUNTER — Encounter (HOSPITAL_COMMUNITY): Payer: Self-pay | Admitting: Emergency Medicine

## 2012-02-13 DIAGNOSIS — A088 Other specified intestinal infections: Secondary | ICD-10-CM

## 2012-02-13 DIAGNOSIS — A084 Viral intestinal infection, unspecified: Secondary | ICD-10-CM

## 2012-02-13 DIAGNOSIS — J069 Acute upper respiratory infection, unspecified: Secondary | ICD-10-CM

## 2012-02-13 MED ORDER — OMEPRAZOLE 20 MG PO CPDR: 20.0000 mg | DELAYED_RELEASE_CAPSULE | Freq: Two times a day (BID) | ORAL | Status: DC

## 2012-02-13 NOTE — ED Notes (Signed)
Pt c/o fever. Hot/chills. Diarrhea x 3 days. Productive cough with clear sputum. Pt states that he has been taking care of girlfriend that has had flu like symptoms.   Pt has only taken ibuprofen for fever.

## 2012-02-13 NOTE — ED Provider Notes (Signed)
History     CSN: [id number]  Arrival date & time 02/13/12  1813   First MD Initiated Contact with Patient 02/13/12 1902      Chief Complaint  Patient presents with  . Fever    100.4. hot/chills. fatigue. diarrhea x 3 days. productive cough clear sputum    (Consider location/radiation/quality/duration/timing/severity/associated sxs/prior treatment) HPI Comments: 27 year old male presents with a cough for one week. Is also had a fever as high as 100.4 at home. He feels hot and cold. It sometimes has mild shortness of breath and feels weak. He also has epigastric discomfort associated with diarrhea for 3 days. He said loose stools 2-3 times per day in the past 2 days.    Past Medical History  Diagnosis Date  . Bronchitis   . Acid reflux disease   . Bronchitis   . GERD (gastroesophageal reflux disease)   . Seasonal allergies   . Pneumonia     Past Surgical History  Procedure Date  . Tonsillectomy   . Adenoidectomy     Family History  Problem Relation Age of Onset  . Diabetes Mother   . Diabetes Father     History  Substance Use Topics  . Smoking status: Current Every Day Smoker -- 1.0 packs/day    Types: Cigarettes  . Smokeless tobacco: Not on file  . Alcohol Use: 0.6 oz/week    1 Cans of beer per week     Comment: social drinker      Review of Systems  Constitutional: Positive for fever, chills and fatigue.  HENT: Positive for congestion. Negative for sore throat, facial swelling and neck pain.   Respiratory: Positive for cough and shortness of breath. Negative for choking, chest tightness and wheezing.   Cardiovascular: Negative for chest pain, palpitations and leg swelling.  Gastrointestinal: Positive for nausea. Negative for vomiting and blood in stool.  Genitourinary: Negative.   Neurological: Negative.     Allergies  Mucinex and Shellfish allergy  Home Medications   Current Outpatient Rx  Name  Route  Sig  Dispense  Refill  . ALBUTEROL SULFATE  HFA 108 (90 BASE) MCG/ACT IN AERS   Inhalation   Inhale 1-2 puffs into the lungs every 6 (six) hours as needed for wheezing.   1 Inhaler   0   . ESOMEPRAZOLE MAGNESIUM 40 MG PO CPDR   Oral   Take 1 capsule (40 mg total) by mouth daily.   30 capsule   0   . OMEPRAZOLE 20 MG PO CPDR   Oral   Take 1 capsule (20 mg total) by mouth 2 (two) times daily.   25 capsule   0     BP 125/81  Pulse 62  Temp 97.9 F (36.6 C) (Oral)  Resp 20  SpO2 99%  Physical Exam  Nursing note and vitals reviewed. Constitutional: He is oriented to person, place, and time. He appears well-developed and well-nourished. No distress.  HENT:       Bilateral TMs are normal Oropharynx with mild erythema, cobblestoning and clear PND. No exudate  Eyes: Conjunctivae normal and EOM are normal.  Neck: Normal range of motion. Neck supple.  Cardiovascular: Normal rate, regular rhythm and normal heart sounds.   Pulmonary/Chest: Effort normal and breath sounds normal. No respiratory distress. He has no wheezes. He has no rales.  Abdominal: Soft. He exhibits no mass. There is tenderness. There is no rebound and no guarding.       Mild tenderness in the epigastrium.  Musculoskeletal: Normal range of motion. He exhibits no edema.  Lymphadenopathy:    He has no cervical adenopathy.  Neurological: He is alert and oriented to person, place, and time.  Skin: Skin is warm and dry. No rash noted.  Psychiatric: He has a normal mood and affect.    ED Course  Procedures (including critical care time)  Labs Reviewed - No data to display No results found.   1. Viral gastroenteritis   2. URI (upper respiratory infection)       MDM  Prilosec 20 mg twice a day Instructions on clear liquid diet and diet for diarrhea They use Imodium right ear for diarrhea if having 3 loose stools per day Rest and drink plenty of fluids for the next 2-3 days. Tylenol every 4 hours when necessary For any worsening new symptoms or  problems may return         Hayden Rasmussen, NP 02/13/12 2029

## 2012-02-17 NOTE — ED Provider Notes (Signed)
Medical screening examination/treatment/procedure(s) were performed by resident physician or non-physician practitioner and as supervising physician I was immediately available for consultation/collaboration.   Birgit Nowling DOUGLAS MD.    Lota Leamer D Demarqus Jocson, MD 02/17/12 2042 

## 2012-03-01 ENCOUNTER — Emergency Department (HOSPITAL_COMMUNITY): Payer: Self-pay

## 2012-03-01 ENCOUNTER — Emergency Department (HOSPITAL_COMMUNITY)
Admission: EM | Admit: 2012-03-01 | Discharge: 2012-03-01 | Disposition: A | Discharge status: Home / Self Care | Payer: Self-pay | Attending: Emergency Medicine | Admitting: Emergency Medicine

## 2012-03-01 ENCOUNTER — Encounter (HOSPITAL_COMMUNITY): Payer: Self-pay | Admitting: Emergency Medicine

## 2012-03-01 DIAGNOSIS — J3489 Other specified disorders of nose and nasal sinuses: Secondary | ICD-10-CM | POA: Insufficient documentation

## 2012-03-01 DIAGNOSIS — F17200 Nicotine dependence, unspecified, uncomplicated: Secondary | ICD-10-CM | POA: Insufficient documentation

## 2012-03-01 DIAGNOSIS — IMO0001 Reserved for inherently not codable concepts without codable children: Secondary | ICD-10-CM | POA: Insufficient documentation

## 2012-03-01 DIAGNOSIS — R093 Abnormal sputum: Secondary | ICD-10-CM | POA: Insufficient documentation

## 2012-03-01 DIAGNOSIS — J029 Acute pharyngitis, unspecified: Secondary | ICD-10-CM | POA: Insufficient documentation

## 2012-03-01 DIAGNOSIS — Z8701 Personal history of pneumonia (recurrent): Secondary | ICD-10-CM | POA: Insufficient documentation

## 2012-03-01 DIAGNOSIS — J209 Acute bronchitis, unspecified: Secondary | ICD-10-CM | POA: Insufficient documentation

## 2012-03-01 DIAGNOSIS — R509 Fever, unspecified: Secondary | ICD-10-CM | POA: Insufficient documentation

## 2012-03-01 DIAGNOSIS — Z79899 Other long term (current) drug therapy: Secondary | ICD-10-CM | POA: Insufficient documentation

## 2012-03-01 DIAGNOSIS — Z8719 Personal history of other diseases of the digestive system: Secondary | ICD-10-CM | POA: Insufficient documentation

## 2012-03-01 DIAGNOSIS — H9209 Otalgia, unspecified ear: Secondary | ICD-10-CM | POA: Insufficient documentation

## 2012-03-01 DIAGNOSIS — J40 Bronchitis, not specified as acute or chronic: Secondary | ICD-10-CM

## 2012-03-01 DIAGNOSIS — R61 Generalized hyperhidrosis: Secondary | ICD-10-CM | POA: Insufficient documentation

## 2012-03-01 MED ORDER — ACETAMINOPHEN 325 MG PO TABS
650.0000 mg | ORAL_TABLET | Freq: Once | ORAL | Status: AC
  Administered 2012-03-01: 650 mg via ORAL
  Filled 2012-03-01: qty 2

## 2012-03-01 MED ORDER — ALBUTEROL SULFATE HFA 108 (90 BASE) MCG/ACT IN AERS
2.0000 | INHALATION_SPRAY | RESPIRATORY_TRACT | Status: DC | PRN
  Administered 2012-03-01: 2 via RESPIRATORY_TRACT
  Filled 2012-03-01: qty 6.7

## 2012-03-01 MED ORDER — AZITHROMYCIN 250 MG PO TABS: ORAL_TABLET | ORAL | Status: DC

## 2012-03-01 MED ORDER — BENZONATATE 200 MG PO CAPS: 200.0000 mg | ORAL_CAPSULE | Freq: Three times a day (TID) | ORAL | Status: DC | PRN

## 2012-03-01 NOTE — ED Notes (Signed)
Patient transported to X-ray 

## 2012-03-01 NOTE — ED Provider Notes (Signed)
History     CSN: [id number]  Arrival date & time 03/01/12  2041   First MD Initiated Contact with Patient 03/01/12 2159      Chief Complaint  Patient presents with  . Generalized Body Aches   HPI  History provided by the patient. Patient is a 27 year old male who is a current 1-2 pack/day smoker with prior history of bronchitis and pneumonia infections who presents with complaints of productive cough, congestion, fever and chills for the past 2-3 days. Patient reports being around a younger brother with similar cough and cold symptoms. Symptoms have been gradually worsening. They are also associated with general body aches and sore throat worse with coughing. Patient denies any other associated symptoms. Denies any vomiting or diarrhea. Denies any chest pain or shortness of breath. Cough is productive of yellow to green phlegm. Patient did take one ibuprofen earlier this evening around 7 PM prior to arrival to help with body aches symptoms. He denies any other aggravating or alleviating factors.    Past Medical History  Diagnosis Date  . Bronchitis   . Acid reflux disease   . Bronchitis   . GERD (gastroesophageal reflux disease)   . Seasonal allergies   . Pneumonia     Past Surgical History  Procedure Date  . Tonsillectomy   . Adenoidectomy     Family History  Problem Relation Age of Onset  . Diabetes Mother   . Diabetes Father     History  Substance Use Topics  . Smoking status: Current Every Day Smoker -- 1.0 packs/day    Types: Cigarettes  . Smokeless tobacco: Not on file  . Alcohol Use: 0.6 oz/week    1 Cans of beer per week     Comment: social drinker      Review of Systems  Constitutional: Positive for fever, chills and diaphoresis.  HENT: Positive for ear pain, congestion, sore throat and rhinorrhea. Negative for hearing loss and trouble swallowing.   Respiratory: Positive for cough.   Cardiovascular: Negative for chest pain and palpitations.   Gastrointestinal: Negative for nausea, vomiting, abdominal pain and diarrhea.  Musculoskeletal: Positive for myalgias.  Neurological: Negative for headaches.  All other systems reviewed and are negative.    Allergies  Shellfish allergy and Mucinex  Home Medications   Current Outpatient Rx  Name  Route  Sig  Dispense  Refill  . ALBUTEROL SULFATE HFA 108 (90 BASE) MCG/ACT IN AERS   Inhalation   Inhale 1-2 puffs into the lungs every 6 (six) hours as needed for wheezing.   1 Inhaler   0   . ESOMEPRAZOLE MAGNESIUM 40 MG PO CPDR   Oral   Take 1 capsule (40 mg total) by mouth daily.   30 capsule   0   . OMEPRAZOLE 20 MG PO CPDR   Oral   Take 1 capsule (20 mg total) by mouth 2 (two) times daily.   25 capsule   0     BP 126/76  Pulse 73  Temp 98.1 F (36.7 C) (Oral)  Resp 24  SpO2 99%  Physical Exam  Nursing note and vitals reviewed. Constitutional: He is oriented to person, place, and time. He appears well-developed and well-nourished. No distress.  HENT:  Head: Normocephalic.  Right Ear: Tympanic membrane normal.  Left Ear: Tympanic membrane normal.  Mouth/Throat: Oropharynx is clear and moist.  Neck: Normal range of motion. Neck supple.       No meningeal sign  Cardiovascular: Normal rate and  regular rhythm.   No murmur heard. Pulmonary/Chest: Effort normal. No respiratory distress. He has wheezes. He has no rales.  Abdominal: Soft.  Lymphadenopathy:    He has no cervical adenopathy.  Neurological: He is alert and oriented to person, place, and time.  Skin: Skin is warm. He is not diaphoretic.  Psychiatric: He has a normal mood and affect. His behavior is normal.    ED Course  Procedures   Dg Chest 2 View  03/01/2012  *RADIOLOGY REPORT*  Clinical Data: Cough, shortness of breath and congestion.  CHEST - 2 VIEW  Comparison: 09/28/2011 and prior chest radiographs  Findings: The cardiomediastinal silhouette is unremarkable. The lungs are clear. There is no  evidence of focal airspace disease, pulmonary edema, suspicious pulmonary nodule/mass, pleural effusion, or pneumothorax. No acute bony abnormalities are identified.  IMPRESSION: No evidence of active cardiopulmonary disease.   Original Report Authenticated By: Harmon Pier, M.D.      1. Bronchitis       MDM  10:00 PM patient seen and evaluated. Patient appears well in no acute distress. Unremarkable vital signs.        Angus Seller, [place] 03/01/12 2308

## 2012-03-01 NOTE — ED Notes (Signed)
Pt back from x-ray.

## 2012-03-01 NOTE — ED Notes (Signed)
Dammen PA at bedside.

## 2012-03-01 NOTE — ED Notes (Addendum)
Patient here with generalized body aches, with chills.  Patient has been taken OTC ibuprofen for the aches.   Patient has been having a cough with sore throat.

## 2012-03-01 NOTE — ED Notes (Signed)
Discharge instructions reviewed. Pt verbalized understanding.  

## 2012-03-01 NOTE — ED Notes (Signed)
Pt c/o generalized body ache, cough, and chills x 3 days. Pt reports he has been around someone who has been sick.

## 2012-03-02 NOTE — ED Provider Notes (Signed)
Medical screening examination/treatment/procedure(s) were performed by non-physician practitioner and as supervising physician I was immediately available for consultation/collaboration.   Johnryan Sao, MD 03/02/12 0005 

## 2012-03-29 IMAGING — CR DG CHEST 2V
2 series · 2 of 2 positions shown · non-contrast
Comparison: 02/27/2011

CLINICAL DATA: Left-sided chest pain

CHEST - 2 VIEW

[w chest pa]
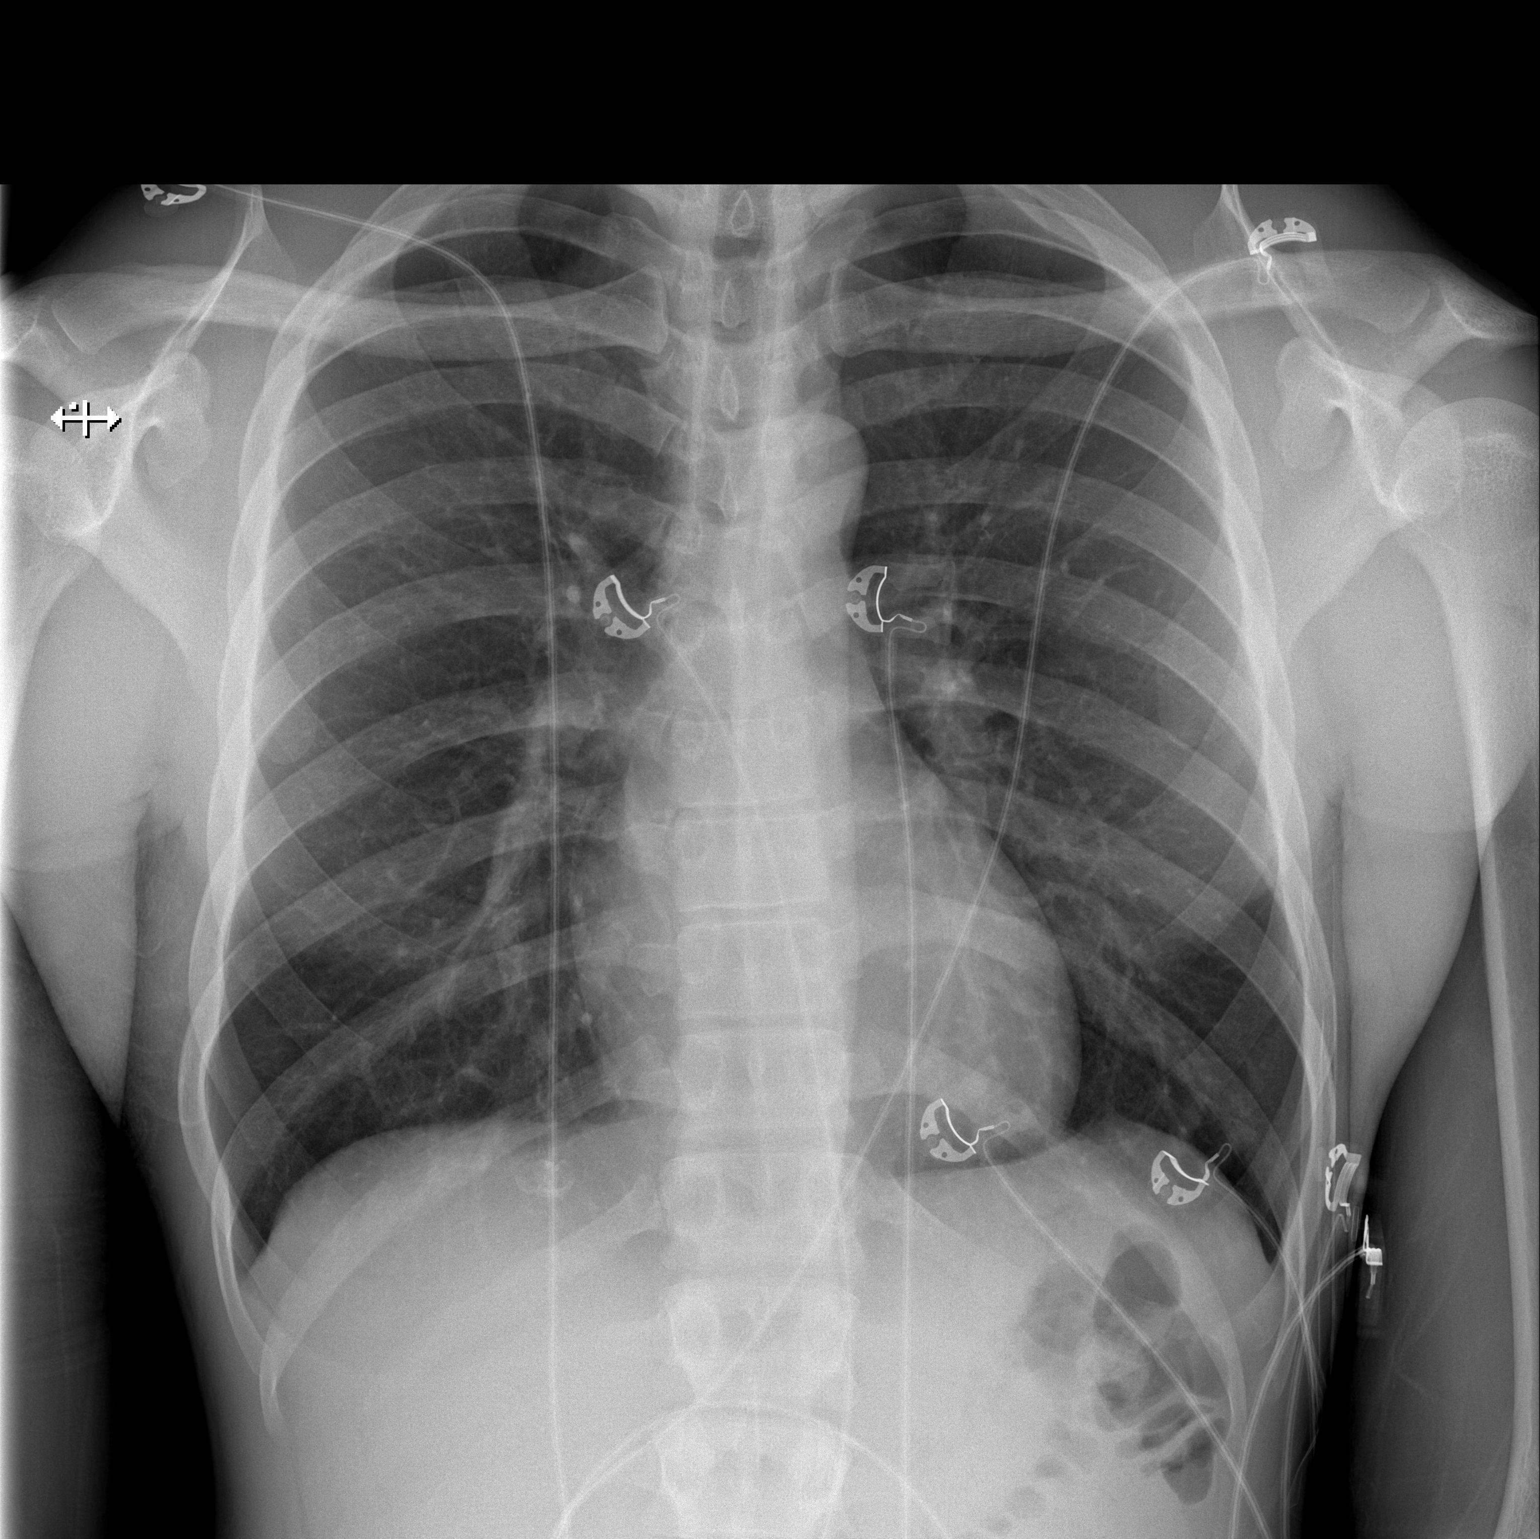

[w chest lat]
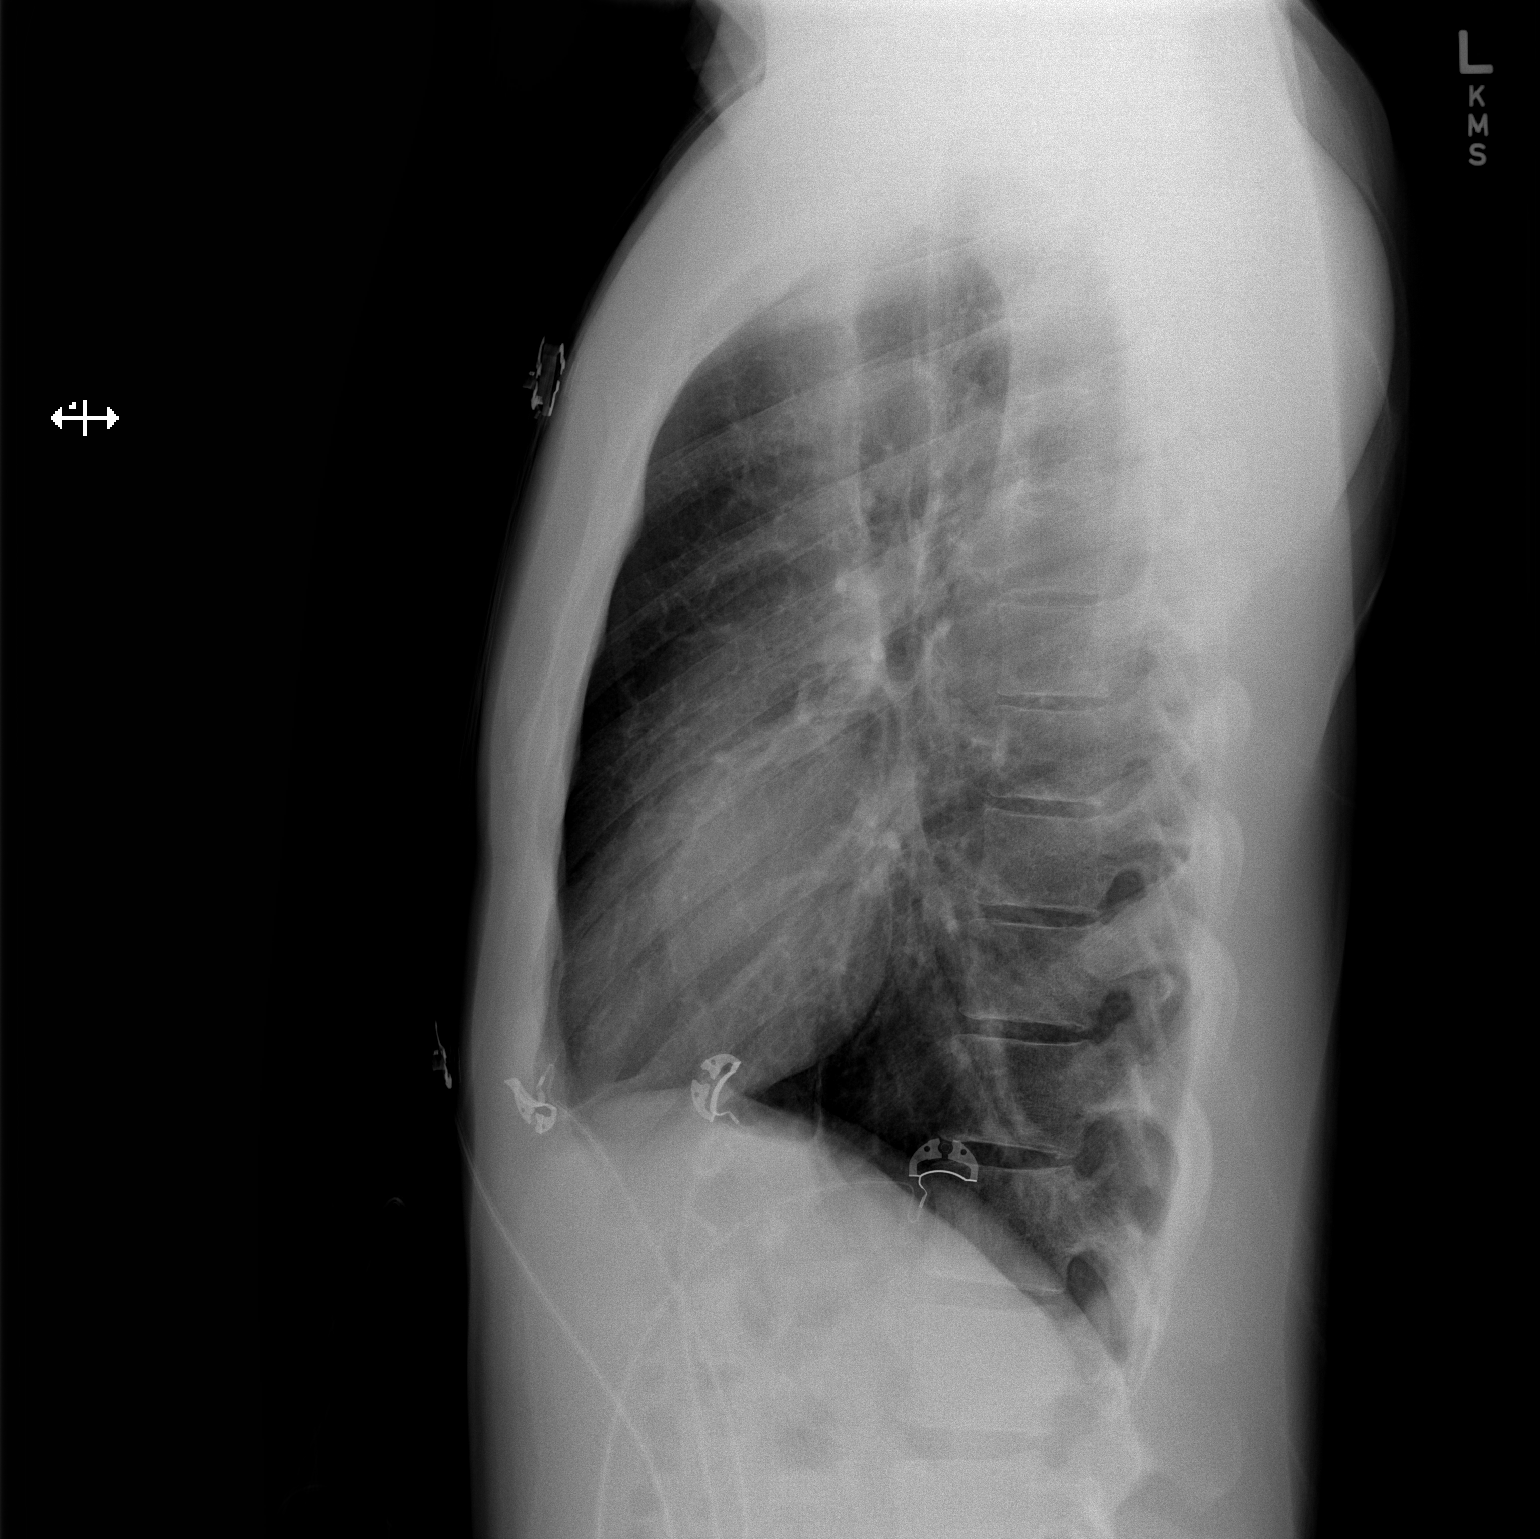

[2 of 2 positions shown; findings below may reference images not displayed]

FINDINGS: Mild peribronchial cuffing is similar to prior.  There is
no focal consolidation, pleural effusion, or pneumothorax.  The
cardiomediastinal contours are within normal limits.  No acute
osseous abnormality.
IMPRESSION: Mild peribronchial cuffing is similar to prior.  No focal
consolidation.

## 2012-07-26 ENCOUNTER — Encounter (HOSPITAL_COMMUNITY): Payer: Self-pay | Admitting: Emergency Medicine

## 2012-07-26 ENCOUNTER — Emergency Department (HOSPITAL_COMMUNITY)
Admission: EM | Admit: 2012-07-26 | Discharge: 2012-07-27 | Disposition: A | Discharge status: Home / Self Care | Payer: Self-pay | Attending: Emergency Medicine | Admitting: Emergency Medicine

## 2012-07-26 DIAGNOSIS — Z711 Person with feared health complaint in whom no diagnosis is made: Secondary | ICD-10-CM | POA: Insufficient documentation

## 2012-07-26 DIAGNOSIS — Z8719 Personal history of other diseases of the digestive system: Secondary | ICD-10-CM | POA: Insufficient documentation

## 2012-07-26 DIAGNOSIS — Z8701 Personal history of pneumonia (recurrent): Secondary | ICD-10-CM | POA: Insufficient documentation

## 2012-07-26 DIAGNOSIS — Y9389 Activity, other specified: Secondary | ICD-10-CM | POA: Insufficient documentation

## 2012-07-26 DIAGNOSIS — Z0000 Encounter for general adult medical examination without abnormal findings: Secondary | ICD-10-CM

## 2012-07-26 DIAGNOSIS — Z8709 Personal history of other diseases of the respiratory system: Secondary | ICD-10-CM | POA: Insufficient documentation

## 2012-07-26 DIAGNOSIS — F17200 Nicotine dependence, unspecified, uncomplicated: Secondary | ICD-10-CM | POA: Insufficient documentation

## 2012-07-26 DIAGNOSIS — Y92009 Unspecified place in unspecified non-institutional (private) residence as the place of occurrence of the external cause: Secondary | ICD-10-CM | POA: Insufficient documentation

## 2012-07-26 NOTE — ED Notes (Signed)
Pt to ED via [facility] with c/o being bit by a spider on left upper chest.  Unable to locate area on chest.  Pt st's since then he has had pain in the muscles of left arm.

## 2012-07-26 NOTE — ED Provider Notes (Signed)
History    This chart was scribed for non-physician practitioner Wynetta Emery, PA working with Sunnie Nielsen, MD by Quintella Reichert, ED scribe.  This patient was seen in room TR11C/TR11C and the patient's care was started at 11:46 PM.  CSN: [id number]  Arrival date & time 07/26/12  2222     Chief Complaint  Patient presents with  . Insect Bite    HPI  HPI Comments: Anthony Pruitt is a 27 y.o. male brought by EMS to the Emergency Department complaining of a spider bite that he sustained 2 days ago and an episode of lightheadedness that occurred tonight.  Pt reports that he was sitting outside on his porch when he felt a bite.  He states that he crushed the insect with his hand and visibly observed that it was a spider.  Pt called EMS because after getting out of the shower tonight he developed lightheadedness, diaphoresis, and a subjective sensation of his left arm "locking up."  He states he felt like he would pass out but denies LOC.  Presently he reports that the fingers of his left hand feel "tight."   Pt has no PCP    Past Medical History  Diagnosis Date  . Bronchitis   . Acid reflux disease   . Bronchitis   . GERD (gastroesophageal reflux disease)   . Seasonal allergies   . Pneumonia    Past Surgical History  Procedure Laterality Date  . Tonsillectomy    . Adenoidectomy     Family History  Problem Relation Age of Onset  . Diabetes Mother   . Diabetes Father    History  Substance Use Topics  . Smoking status: Current Every Day Smoker -- 1.00 packs/day    Types: Cigarettes  . Smokeless tobacco: Not on file  . Alcohol Use: 0.6 oz/week    1 Cans of beer per week     Comment: social drinker    Review of Systems  Constitutional:       Negative except as described in HPI  HENT:       Negative except as described in HPI  Respiratory:       Negative except as described in HPI  Cardiovascular:       Negative except as described in HPI  Gastrointestinal:        Negative except as described in HPI  Genitourinary:       Negative except as described in HPI  Musculoskeletal:       Negative except as described in HPI  Skin:       Negative except as described in HPI  Neurological:       Negative except as described in HPI  All other systems reviewed and are negative.    Allergies  Shellfish allergy and Mucinex  Home Medications   Current Outpatient Rx  Name  Route  Sig  Dispense  Refill  . albuterol (PROVENTIL HFA;VENTOLIN HFA) 108 (90 BASE) MCG/ACT inhaler   Inhalation   Inhale 1-2 puffs into the lungs every 6 (six) hours as needed. For wheezing/shortness of breath          BP 122/78  Pulse 64  Temp(Src) 98.4 F (36.9 C) (Oral)  Resp 16  SpO2 99%  Physical Exam  Nursing note and vitals reviewed. Constitutional: He is oriented to person, place, and time. He appears well-developed and well-nourished. No distress.  HENT:  Head: Normocephalic and atraumatic.  Mouth/Throat: Oropharynx is clear and moist.  Eyes: Conjunctivae and  EOM are normal. Pupils are equal, round, and reactive to light.  Neck: Normal range of motion.  Cardiovascular: Normal rate, regular rhythm, normal heart sounds and intact distal pulses.   Pulmonary/Chest: Effort normal and breath sounds normal. No stridor. No respiratory distress. He has no wheezes. He has no rales. He exhibits no tenderness.  Abdominal: Soft. Bowel sounds are normal. He exhibits no distension and no mass. There is no tenderness. There is no rebound and no guarding.  Musculoskeletal: Normal range of motion.  Neurological: He is alert and oriented to person, place, and time.  Follows commands, Goal oriented speech, Strength is 5 out of 5x4 extremities, patient ambulates with a coordinated in nonantalgic gait. Sensation is grossly intact.   Skin:  No sign of bite, puncture Mark, erythema to anterior chest  Psychiatric: He has a normal mood and affect.    ED Course  Procedures  (including critical care time)  DIAGNOSTIC STUDIES: Oxygen Saturation is 99% on room air, normal by my interpretation.    COORDINATION OF CARE: 11:50 PM-Informed pt that his symptoms do not indicate a need for medical intervention.   Discussed treatment plan including referral to PCP and pt agreed to plan.    Labs Reviewed - No data to display No results found. No diagnosis found.  MDM   Filed Vitals:   07/26/12 2224  BP: 122/78  Pulse: 64  Temp: 98.4 F (36.9 C)  TempSrc: Oral  Resp: 16  SpO2: 99%     Anthony Pruitt is a 27 y.o. male on in by EMS for possible spider bite sustained 2 days ago with episode of dizziness onset today. PE exam revealed no abnormalities.  The patient is hemodynamically stable, appropriate for, and amenable to, discharge at this time. Pt verbalized understanding and agrees with care plan. Outpatient follow-up and return precautions given.    I personally performed the services described in this documentation, which was scribed in my presence. The recorded information has been reviewed and is accurate.     Wynetta Emery, PA-C 07/27/12 0101

## 2012-07-26 NOTE — ED Notes (Signed)
PT. REPORTS SPIDER BITE AT LEFT CHEST 2 DAYS AGO , CALLED EMS , SKIN IS CLEAR /NO LESIONS OR SWELLING/DRAINAGE  , RESPIRATIONS UNLABORED /AMBULATORY.

## 2012-07-27 NOTE — ED Provider Notes (Signed)
Medical screening examination/treatment/procedure(s) were performed by non-physician practitioner and as supervising physician I was immediately available for consultation/collaboration.  Sunnie Nielsen, MD 07/27/12 [phone]

## 2012-11-14 ENCOUNTER — Emergency Department (HOSPITAL_COMMUNITY)
Admission: EM | Admit: 2012-11-14 | Discharge: 2012-11-14 | Disposition: A | Discharge status: Home / Self Care | Payer: Self-pay | Attending: Emergency Medicine | Admitting: Emergency Medicine

## 2012-11-14 ENCOUNTER — Emergency Department (HOSPITAL_COMMUNITY): Payer: Self-pay

## 2012-11-14 ENCOUNTER — Encounter (HOSPITAL_COMMUNITY): Payer: Self-pay | Admitting: Emergency Medicine

## 2012-11-14 DIAGNOSIS — R61 Generalized hyperhidrosis: Secondary | ICD-10-CM | POA: Insufficient documentation

## 2012-11-14 DIAGNOSIS — R5381 Other malaise: Secondary | ICD-10-CM | POA: Insufficient documentation

## 2012-11-14 DIAGNOSIS — R51 Headache: Secondary | ICD-10-CM | POA: Insufficient documentation

## 2012-11-14 DIAGNOSIS — J029 Acute pharyngitis, unspecified: Secondary | ICD-10-CM | POA: Insufficient documentation

## 2012-11-14 DIAGNOSIS — Z8701 Personal history of pneumonia (recurrent): Secondary | ICD-10-CM | POA: Insufficient documentation

## 2012-11-14 DIAGNOSIS — Z8719 Personal history of other diseases of the digestive system: Secondary | ICD-10-CM | POA: Insufficient documentation

## 2012-11-14 DIAGNOSIS — IMO0001 Reserved for inherently not codable concepts without codable children: Secondary | ICD-10-CM | POA: Insufficient documentation

## 2012-11-14 DIAGNOSIS — F17200 Nicotine dependence, unspecified, uncomplicated: Secondary | ICD-10-CM | POA: Insufficient documentation

## 2012-11-14 DIAGNOSIS — J40 Bronchitis, not specified as acute or chronic: Secondary | ICD-10-CM | POA: Insufficient documentation

## 2012-11-14 DIAGNOSIS — R1110 Vomiting, unspecified: Secondary | ICD-10-CM | POA: Insufficient documentation

## 2012-11-14 DIAGNOSIS — R079 Chest pain, unspecified: Secondary | ICD-10-CM | POA: Insufficient documentation

## 2012-11-14 DIAGNOSIS — J069 Acute upper respiratory infection, unspecified: Secondary | ICD-10-CM

## 2012-11-14 DIAGNOSIS — R197 Diarrhea, unspecified: Secondary | ICD-10-CM | POA: Insufficient documentation

## 2012-11-14 MED ORDER — IPRATROPIUM BROMIDE 0.02 % IN SOLN
0.5000 mg | RESPIRATORY_TRACT | Status: DC
  Administered 2012-11-14: 0.5 mg via RESPIRATORY_TRACT
  Filled 2012-11-14: qty 2.5

## 2012-11-14 MED ORDER — ALBUTEROL SULFATE HFA 108 (90 BASE) MCG/ACT IN AERS: 1.0000 | INHALATION_SPRAY | Freq: Four times a day (QID) | RESPIRATORY_TRACT | Status: DC | PRN

## 2012-11-14 MED ORDER — ALBUTEROL SULFATE (5 MG/ML) 0.5% IN NEBU
2.5000 mg | INHALATION_SOLUTION | RESPIRATORY_TRACT | Status: DC
  Administered 2012-11-14: 2.5 mg via RESPIRATORY_TRACT
  Filled 2012-11-14: qty 0.5

## 2012-11-14 NOTE — ED Notes (Signed)
Pt states for three days he has been having a fever, diarrhea, chest and head cold.

## 2012-11-14 NOTE — ED Provider Notes (Signed)
CSN: [id number]     Arrival date & time 11/14/12  1600 History  This chart was scribed for non-physician practitioner Coral Ceo, PA-C, working with Flint Melter, MD by Dorothey Baseman, ED Scribe. This patient was seen in room TR08C/TR08C and the patient's care was started at 6:07 PM.    Chief Complaint  Patient presents with  . Fever  . Diarrhea   The history is provided by the patient. No language interpreter was used.   HPI Comments: Anthony Pruitt is a 27 y.o. Male with a PMH of bronchitis, GERD, and pneumonia who presents to the Emergency Department with multiple complaints.  He has a fever (99.7 measured at home) onset 3 days ago with associated sore throat, pressure-like headache, cough with yellow-brown sputum, myalgias, chills, diaphoresis, fatigue, chest pain, wheezes, rhinorrhea, congestion, intermittent episodes of diarrhea (3-4 episodes onset today), and emesis (1 episode today). His chest pain is located in the mid-sternal region of his chest without radiation and is worse without coughing or movement.  He denies shortness of breath, ear pain, hematochezia, hematemesis, dysuria, hematuria. He states his symptoms started with a sore throat and have now progressed to a respiratory and sinus pressure headache.  He states that he took albuterol and DayQuil at home with mild, temporary relief of his respiratory symptoms. He states that he has been around his aunt who has recently been sick. Patient denies history of DVT or PE. He denies any personal or familial history of cardiac disease. He denies any recent travel. Patient reports a history of bronchitis, but does not have an inhaler.  He used his son's inhaler once which helped his symptoms at home.  Patient is a current every day smoker, 1 PPD.   Past Medical History  Diagnosis Date  . Bronchitis   . Acid reflux disease   . Bronchitis   . GERD (gastroesophageal reflux disease)   . Seasonal allergies   . Pneumonia    Past  Surgical History  Procedure Laterality Date  . Tonsillectomy    . Adenoidectomy     Family History  Problem Relation Age of Onset  . Diabetes Mother   . Diabetes Father    History  Substance Use Topics  . Smoking status: Current Every Day Smoker -- 1.00 packs/day    Types: Cigarettes  . Smokeless tobacco: Not on file  . Alcohol Use: 0.6 oz/week    1 Cans of beer per week     Comment: social drinker    Review of Systems  Constitutional: Positive for fever, chills, diaphoresis and fatigue. Negative for activity change and appetite change.  HENT: Positive for rhinorrhea and sore throat. Negative for drooling, ear pain, trouble swallowing and voice change.   Respiratory: Positive for cough and wheezing. Negative for shortness of breath.   Cardiovascular: Positive for chest pain. Negative for leg swelling.  Gastrointestinal: Positive for vomiting and diarrhea. Negative for nausea, abdominal pain, constipation, blood in stool and rectal pain.  Genitourinary: Negative for dysuria and hematuria.  Musculoskeletal: Positive for myalgias. Negative for back pain.  Skin: Negative for color change.  Neurological: Positive for headaches. Negative for dizziness, syncope, weakness, light-headedness and numbness.  Psychiatric/Behavioral: Negative for confusion.  All other systems reviewed and are negative.    Allergies  Shellfish allergy and Mucinex  Home Medications   Current Outpatient Rx  Name  Route  Sig  Dispense  Refill  . EXPIRED: albuterol (PROVENTIL HFA;VENTOLIN HFA) 108 (90 BASE) MCG/ACT inhaler  Inhalation   Inhale 1-2 puffs into the lungs every 6 (six) hours as needed. For wheezing/shortness of breath          Triage Vitals: BP 126/84  Pulse 75  Temp(Src) 98.7 F (37.1 C) (Oral)  Resp 18  Wt 164 lb 9.6 oz (74.662 kg)  BMI 24.3 kg/m2  SpO2 100%  Filed Vitals:   11/14/12 1606 11/14/12 1842  BP: 126/84 126/78  Pulse: 75 71  Temp: 98.7 F (37.1 C) 98.3 F (36.8  C)  TempSrc: Oral Oral  Resp: 18 18  Weight: 164 lb 9.6 oz (74.662 kg)   SpO2: 100% 98%     Physical Exam  Nursing note and vitals reviewed. Constitutional: He is oriented to person, place, and time. He appears well-developed and well-nourished. No distress.  HENT:  Head: Normocephalic and atraumatic.  Right Ear: External ear normal.  Left Ear: External ear normal.  Nose: Nose normal.  Mouth/Throat: Oropharynx is clear and moist. No oropharyngeal exudate.  No pharyngeal erythema or tonsillar swelling. Uvula midline. No trismus. TM's gray and translucent bilaterally, Rhinorrhea  Eyes: Conjunctivae are normal. Right eye exhibits no discharge. Left eye exhibits no discharge.  Neck: Normal range of motion. Neck supple.  No LAD. No tenderness to palpation to the neck throughout  Cardiovascular: Normal rate, regular rhythm, normal heart sounds and intact distal pulses.  Exam reveals no gallop and no friction rub.   No murmur heard. Dorsalis pedis pulses present bilaterally  Pulmonary/Chest: Effort normal. No respiratory distress. He has wheezes. He has no rales. He exhibits no tenderness.  Expiratory and inspiratory wheezes throughout.   Abdominal: Soft. Bowel sounds are normal. He exhibits no distension. There is no tenderness.  Musculoskeletal: Normal range of motion. He exhibits no edema and no tenderness.  No calf tenderness or leg edema bilaterally  Neurological: He is alert and oriented to person, place, and time.  Skin: Skin is warm and dry. He is not diaphoretic.  Psychiatric: He has a normal mood and affect. His behavior is normal.    ED Course  Procedures (including critical care time)  Medications  ipratropium (ATROVENT) nebulizer solution 0.5 mg (0.5 mg Nebulization Given 11/14/12 1845)    And  albuterol (PROVENTIL) (5 MG/ML) 0.5% nebulizer solution 2.5 mg (2.5 mg Nebulization Given 11/14/12 1846)    DIAGNOSTIC STUDIES: Oxygen Saturation is 100% on room air, normal  by my interpretation.    COORDINATION OF CARE: 6:14 PM- Will order a breathing treatment to manage symptoms. Will order a chest x-ray and an EKG. Discussed treatment plan with patient at bedside and patient verbalized agreement.   Labs Review Labs Reviewed - No data to display  Imaging Review Dg Chest 2 View  11/14/2012   CLINICAL DATA:  Chest pain and cough  EXAM: CHEST  2 VIEW  COMPARISON:  March 01, 2012  FINDINGS: Lungs are clear. Heart size and pulmonary vascularity are normal. No adenopathy. No pneumothorax. No bone lesions.  IMPRESSION: No abnormality noted.   Electronically Signed   By: Bretta Bang M.D.   On: 11/14/2012 19:11    EKG Interpretation   None       Date: 11/15/2012  Rate: 71  Rhythm: normal sinus rhythm  QRS Axis: left  Intervals: normal  ST/T Wave abnormalities: normal  Conduction Disutrbances:none  Narrative Interpretation:   Old EKG Reviewed: none available   MDM   1. URI (upper respiratory infection)     ETAI COPADO is a 27 y.o. Male  with a PMH of bronchitis, GERD, and pneumonia who presents to the Emergency Department with multiple complaints. Duoneb, EKG, chest x-ray ordered.   Rechecks  7:38 PM = Patient appears much better and states that his respiratory issues and chest pain have since resolved after receiving the breathing treatment. Discussed treatment plan with patient at bedside and patient verbalized agreement. Lungs clear to auscultation.  8:45 PM = Patient appears restless wandering the hallway and is inquiring about when he can be discharged   Etiology of multiple complaints is likely due to a URI.  Patient's chest x-ray was negative for an acute cardiopulmonary process.  Lungs were clear after one Duoneb treated in the ED.  An EKG was performed due to chest pain, which was negative for acute ischemic injury.  Coughing likely the etiology of a likely musculoskeletal chest pain.  Chest pain resolved throughout ED visit.  He  has low risk factors for cardiac disease. Wells score 0.  PERC negative. He was afebrile and non-toxic in appearance.  Patient was prescribed Aluberol for outpatient management.  Patient was instructed to return to the ED if they experience any fever, repeated vomiting, coughing up blood, wheezing not relieved by the inhaler, chest pain, SOB, leg edema, or other concerns.  Patient encouraged to follow up with a PCP.  Patient was in agreement with discharge and plan.     Final impressions: 1. URI     Luiz Iron PA-C    I personally performed the services described in this documentation, which was scribed in my presence. The recorded information has been reviewed and is accurate.     Jillyn Ledger, PA-C 11/15/12 [phone]

## 2012-11-14 NOTE — ED Notes (Signed)
Pt st's he has had diarrhea x's 2-3 days.  Vomited x's 1 yesterday.  Also elevated temp x's days (took dayquil) 2 hrs PTA to ED.  Pt also c/o productive cough

## 2012-11-16 NOTE — ED Provider Notes (Signed)
Medical screening examination/treatment/procedure(s) were performed by non-physician practitioner and as supervising physician I was immediately available for consultation/collaboration.  Mckinley Adelstein L Sakeenah Valcarcel, MD 11/16/12 1057 

## 2012-12-09 ENCOUNTER — Emergency Department (HOSPITAL_COMMUNITY)
Admission: EM | Admit: 2012-12-09 | Discharge: 2012-12-09 | Discharge status: Absent without leave | Payer: Self-pay | Source: Home / Self Care

## 2012-12-09 NOTE — ED Notes (Signed)
Pt was called to exam room for evaluation but was not present in the waiting.  Registration associate reports that the patient's presence has not been noted in the waiting area since shortly after registration.

## 2013-05-16 ENCOUNTER — Emergency Department (HOSPITAL_COMMUNITY): Payer: Self-pay

## 2013-05-16 ENCOUNTER — Encounter (HOSPITAL_COMMUNITY): Payer: Self-pay | Admitting: Emergency Medicine

## 2013-05-16 DIAGNOSIS — X58XXXA Exposure to other specified factors, initial encounter: Secondary | ICD-10-CM | POA: Insufficient documentation

## 2013-05-16 DIAGNOSIS — F17200 Nicotine dependence, unspecified, uncomplicated: Secondary | ICD-10-CM | POA: Insufficient documentation

## 2013-05-16 DIAGNOSIS — Y92838 Other recreation area as the place of occurrence of the external cause: Secondary | ICD-10-CM

## 2013-05-16 DIAGNOSIS — Y9371 Activity, boxing: Secondary | ICD-10-CM | POA: Insufficient documentation

## 2013-05-16 DIAGNOSIS — Z79899 Other long term (current) drug therapy: Secondary | ICD-10-CM | POA: Insufficient documentation

## 2013-05-16 DIAGNOSIS — Y9239 Other specified sports and athletic area as the place of occurrence of the external cause: Secondary | ICD-10-CM | POA: Insufficient documentation

## 2013-05-16 DIAGNOSIS — Z8709 Personal history of other diseases of the respiratory system: Secondary | ICD-10-CM | POA: Insufficient documentation

## 2013-05-16 DIAGNOSIS — IMO0002 Reserved for concepts with insufficient information to code with codable children: Secondary | ICD-10-CM | POA: Insufficient documentation

## 2013-05-16 DIAGNOSIS — K219 Gastro-esophageal reflux disease without esophagitis: Secondary | ICD-10-CM | POA: Insufficient documentation

## 2013-05-16 DIAGNOSIS — Z8701 Personal history of pneumonia (recurrent): Secondary | ICD-10-CM | POA: Insufficient documentation

## 2013-05-16 NOTE — ED Notes (Signed)
Patient transported to X-ray 

## 2013-05-16 NOTE — ED Notes (Addendum)
Pt in via EMS to triage c/o upper chest pain to left and right side of his chest, also n/v with this, history of similar symptoms and was dx with reflux but states this feels worse- pt also c/o left shoulder pain since and injury two days ago, states he was playing and he thinks it popped out of place, normal moment of arm noted, increased pain with movement, CMS intact, pt states his main complaint at this time is his shoulder.

## 2013-05-16 NOTE — ED Notes (Addendum)
Radiology tech. and myself unable to find pt in waiting room for xray.

## 2013-05-17 ENCOUNTER — Emergency Department (HOSPITAL_COMMUNITY)
Admission: EM | Admit: 2013-05-17 | Discharge: 2013-05-17 | Disposition: A | Discharge status: Home / Self Care | Payer: Self-pay | Attending: Emergency Medicine | Admitting: Emergency Medicine

## 2013-05-17 DIAGNOSIS — S43409A Unspecified sprain of unspecified shoulder joint, initial encounter: Secondary | ICD-10-CM

## 2013-05-17 DIAGNOSIS — K219 Gastro-esophageal reflux disease without esophagitis: Secondary | ICD-10-CM

## 2013-05-17 MED ORDER — FAMOTIDINE 20 MG PO TABS: 20.0000 mg | ORAL_TABLET | Freq: Two times a day (BID) | ORAL | Status: DC

## 2013-05-17 MED ORDER — HYDROCODONE-ACETAMINOPHEN 5-325 MG PO TABS
1.0000 | ORAL_TABLET | Freq: Once | ORAL | Status: AC
  Administered 2013-05-17: 1 via ORAL
  Filled 2013-05-17: qty 1

## 2013-05-17 MED ORDER — GI COCKTAIL ~~LOC~~
30.0000 mL | Freq: Once | ORAL | Status: AC
  Administered 2013-05-17: 30 mL via ORAL
  Filled 2013-05-17: qty 30

## 2013-05-17 MED ORDER — HYDROCODONE-ACETAMINOPHEN 5-325 MG PO TABS: 1.0000 | ORAL_TABLET | Freq: Four times a day (QID) | ORAL | Status: DC | PRN

## 2013-05-17 NOTE — Discharge Instructions (Signed)
Return here as needed. Follow up with a primary doctor. Use ice and heat on your shoulder

## 2013-05-17 NOTE — ED Provider Notes (Signed)
CSN: [id number]     Arrival date & time 05/16/13  2213 History   First MD Initiated Contact with Patient 05/17/13 0036     Chief Complaint  Patient presents with  . Shoulder Pain  . Chest Pain     (Consider location/radiation/quality/duration/timing/severity/associated sxs/prior Treatment) HPI Patient presents emergency department with upper chest pain, to the right and left side of his chest.  Patient injured his shoulder 2 days, ago, boxing.  The patient, states, that he had a similar symptoms in the past of burning chest pain, when he had reflux.  Patient denies shortness of breath, nausea, vomiting, diarrhea, diaphoresis, weakness, dizziness, headache, blurred vision, rash, back pain, neck pain, abdominal pain, or syncope.  Patient, states she did not take any medications prior to arrival Past Medical History  Diagnosis Date  . Bronchitis   . Acid reflux disease   . Bronchitis   . GERD (gastroesophageal reflux disease)   . Seasonal allergies   . Pneumonia    Past Surgical History  Procedure Laterality Date  . Tonsillectomy    . Adenoidectomy     Family History  Problem Relation Age of Onset  . Diabetes Mother   . Diabetes Father    History  Substance Use Topics  . Smoking status: Current Every Day Smoker -- 1.00 packs/day    Types: Cigarettes  . Smokeless tobacco: Not on file  . Alcohol Use: 0.6 oz/week    1 Cans of beer per week     Comment: social drinker    Review of Systems  All other systems negative except as documented in the HPI. All pertinent positives and negatives as reviewed in the HPI.   Allergies  Shellfish allergy and Mucinex  Home Medications   Prior to Admission medications   Medication Sig Start Date End Date Taking? Authorizing Provider  albuterol (PROVENTIL HFA;VENTOLIN HFA) 108 (90 BASE) MCG/ACT inhaler Inhale 1-2 puffs into the lungs every 6 (six) hours as needed for wheezing or shortness of breath.   Yes Historical Provider, MD   omeprazole (PRILOSEC OTC) 20 MG tablet Take 20 mg by mouth daily.   Yes Historical Provider, MD   BP 131/85  Pulse 63  Temp(Src) 98.3 F (36.8 C) (Oral)  Resp 17  SpO2 99% Physical Exam  Nursing note and vitals reviewed. Constitutional: He is oriented to person, place, and time. He appears well-developed and well-nourished. No distress.  HENT:  Head: Normocephalic and atraumatic.  Mouth/Throat: Oropharynx is clear and moist.  Eyes: Pupils are equal, round, and reactive to light.  Neck: Normal range of motion. Neck supple.  Cardiovascular: Normal rate, regular rhythm and normal heart sounds.  Exam reveals no gallop and no friction rub.   No murmur heard. Pulmonary/Chest: Effort normal and breath sounds normal. No respiratory distress.  Musculoskeletal:       Left shoulder: He exhibits tenderness and pain. He exhibits normal range of motion, no bony tenderness, no swelling, no effusion, no deformity, no spasm, normal pulse and normal strength.  Neurological: He is alert and oriented to person, place, and time.  Skin: Skin is warm and dry. No erythema.    ED Course  Procedures (including critical care time) Labs Review Labs Reviewed - No data to display  Imaging Review Dg Shoulder Left  05/16/2013   CLINICAL DATA:  Left shoulder pain  EXAM: LEFT SHOULDER - 2+ VIEW  COMPARISON:  None.  FINDINGS: There is no evidence of fracture or dislocation. There is no evidence of  arthropathy or other focal bone abnormality. Soft tissues are unremarkable.  IMPRESSION: Negative.   Electronically Signed   By: Elige KoHetal  Patel   On: 05/16/2013 23:26     EKG Interpretation   Date/Time:  Thursday May 16 2013 22:29:45 EDT Ventricular Rate:  74 PR Interval:  126 QRS Duration: 92 QT Interval:  358 QTC Calculation: 397 R Axis:   82 Text Interpretation:  Normal sinus rhythm Right atrial enlargement Minimal  voltage criteria for LVH, may be normal variant Borderline ECG No  significant change since  last tracing Confirmed by OTTER  MD, OLGA ([id number])  on 05/17/2013 12:36:27 AM        Patient is having atypical symptoms for chest pain.  Patient is advised return here as needed.  Patient is advised to followup with her primary care Dr. urgent care.  Also told to follow with orthopedics, for her shoulder.  Patient is advised to use ice and heat on the shoulder  Carlyle DollyChristopher W Amani Nodarse, PA-C 05/18/13 [phone]

## 2013-05-20 NOTE — ED Provider Notes (Signed)
Medical screening examination/treatment/procedure(s) were performed by non-physician practitioner and as supervising physician I was immediately available for consultation/collaboration.   EKG Interpretation   Date/Time:  Thursday May 16 2013 22:29:45 EDT Ventricular Rate:  74 PR Interval:  126 QRS Duration: 92 QT Interval:  358 QTC Calculation: 397 R Axis:   82 Text Interpretation:  Normal sinus rhythm Right atrial enlargement Minimal  voltage criteria for LVH, may be normal variant Borderline ECG No  significant change since last tracing Confirmed by Abbiegail Landgren  MD, Nicholaus Steinke ([id number])  on 05/17/2013 12:36:27 AM       Olivia Mackielga M Mirel Hundal, MD 05/20/13 1118

## 2013-09-11 ENCOUNTER — Emergency Department (HOSPITAL_COMMUNITY): Payer: Self-pay

## 2013-09-11 ENCOUNTER — Encounter (HOSPITAL_COMMUNITY): Payer: Self-pay | Admitting: Emergency Medicine

## 2013-09-11 ENCOUNTER — Emergency Department (HOSPITAL_COMMUNITY)
Admission: EM | Admit: 2013-09-11 | Discharge: 2013-09-12 | Discharge status: Against Medical Advice | Payer: Self-pay | Attending: Emergency Medicine | Admitting: Emergency Medicine

## 2013-09-11 DIAGNOSIS — R51 Headache: Secondary | ICD-10-CM | POA: Insufficient documentation

## 2013-09-11 DIAGNOSIS — R079 Chest pain, unspecified: Secondary | ICD-10-CM | POA: Insufficient documentation

## 2013-09-11 DIAGNOSIS — F17200 Nicotine dependence, unspecified, uncomplicated: Secondary | ICD-10-CM | POA: Insufficient documentation

## 2013-09-11 DIAGNOSIS — H538 Other visual disturbances: Secondary | ICD-10-CM | POA: Insufficient documentation

## 2013-09-11 DIAGNOSIS — R209 Unspecified disturbances of skin sensation: Secondary | ICD-10-CM | POA: Insufficient documentation

## 2013-09-11 LAB — CBC
HEMATOCRIT: 41.9 % (ref 39.0–52.0)
Hemoglobin: 14 g/dL (ref 13.0–17.0)
MCH: 24 pg — ABNORMAL LOW (ref 26.0–34.0)
MCHC: 33.4 g/dL (ref 30.0–36.0)
MCV: 71.7 fL — ABNORMAL LOW (ref 78.0–100.0)
Platelets: 234 10*3/uL (ref 150–400)
RBC: 5.84 MIL/uL — ABNORMAL HIGH (ref 4.22–5.81)
RDW: 14.7 % (ref 11.5–15.5)
WBC: 6.1 10*3/uL (ref 4.0–10.5)

## 2013-09-11 LAB — BASIC METABOLIC PANEL
Anion gap: 14 (ref 5–15)
BUN: 11 mg/dL (ref 6–23)
CO2: 24 mEq/L (ref 19–32)
CREATININE: 1.19 mg/dL (ref 0.50–1.35)
Calcium: 9 mg/dL (ref 8.4–10.5)
Chloride: 103 mEq/L (ref 96–112)
GFR calc non Af Amer: 82 mL/min — ABNORMAL LOW (ref >90)
Glucose, Bld: 94 mg/dL (ref 70–99)
POTASSIUM: 3.8 meq/L (ref 3.7–5.3)
Sodium: 141 mEq/L (ref 137–147)

## 2013-09-11 LAB — I-STAT TROPONIN, ED: Troponin i, poc: 0 ng/mL (ref 0.00–0.08)

## 2013-09-11 NOTE — ED Notes (Signed)
No answer x1

## 2013-09-11 NOTE — ED Notes (Addendum)
Pt reports sudden onset L sided headache 1 hour ago. Since then he is having cp, blurred vision and pain and numbness to L arm. Pt ambulatory in triage. Equal grip strength. 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

## 2013-11-23 ENCOUNTER — Emergency Department (HOSPITAL_COMMUNITY)
Admission: EM | Admit: 2013-11-23 | Discharge: 2013-11-24 | Disposition: A | Discharge status: Home / Self Care | Payer: Self-pay | Attending: Emergency Medicine | Admitting: Emergency Medicine

## 2013-11-23 ENCOUNTER — Encounter (HOSPITAL_COMMUNITY): Payer: Self-pay | Admitting: Emergency Medicine

## 2013-11-23 DIAGNOSIS — R110 Nausea: Secondary | ICD-10-CM | POA: Insufficient documentation

## 2013-11-23 DIAGNOSIS — Z8709 Personal history of other diseases of the respiratory system: Secondary | ICD-10-CM | POA: Insufficient documentation

## 2013-11-23 DIAGNOSIS — Z720 Tobacco use: Secondary | ICD-10-CM | POA: Insufficient documentation

## 2013-11-23 DIAGNOSIS — Z8701 Personal history of pneumonia (recurrent): Secondary | ICD-10-CM | POA: Insufficient documentation

## 2013-11-23 DIAGNOSIS — Z79899 Other long term (current) drug therapy: Secondary | ICD-10-CM | POA: Insufficient documentation

## 2013-11-23 DIAGNOSIS — K219 Gastro-esophageal reflux disease without esophagitis: Secondary | ICD-10-CM | POA: Insufficient documentation

## 2013-11-23 DIAGNOSIS — R079 Chest pain, unspecified: Secondary | ICD-10-CM | POA: Insufficient documentation

## 2013-11-23 MED ORDER — ASPIRIN 81 MG PO CHEW
324.0000 mg | CHEWABLE_TABLET | Freq: Once | ORAL | Status: DC
  Filled 2013-11-23: qty 4

## 2013-11-23 NOTE — ED Notes (Signed)
Per EMS: pt from home with chest pain that started today.  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  Pt denies any cocaine usage today.  Pt given 1 nitro with EMS, pain currently 2/10.

## 2013-11-23 NOTE — ED Provider Notes (Signed)
CSN: [id number]     Arrival date & time 11/23/13  2347 History   First MD Initiated Contact with Patient 11/23/13 2344     No chief complaint on file.    (Consider location/radiation/quality/duration/timing/severity/associated sxs/prior Treatment) Patient is a 28 y.o. male presenting with chest pain.  Chest Pain Associated symptoms: nausea   Associated symptoms: no abdominal pain and not vomiting    Patient p/w CP.  Onset, ~8hr pta. Since onset there has been sternal pain, sharp, burning with nausea, no emesis. Pain is distinct from prior GERD. No dyspnea. No pleuritic or exertional changes. Patient smoke, drinks, uses cocaine, but none in the past day.    Past Medical History  Diagnosis Date  . Bronchitis   . Acid reflux disease   . Bronchitis   . GERD (gastroesophageal reflux disease)   . Seasonal allergies   . Pneumonia    Past Surgical History  Procedure Laterality Date  . Tonsillectomy    . Adenoidectomy     Family History  Problem Relation Age of Onset  . Diabetes Mother   . Diabetes Father    History  Substance Use Topics  . Smoking status: Current Every Day Smoker -- 1.00 packs/day    Types: Cigarettes  . Smokeless tobacco: Not on file  . Alcohol Use: 0.6 oz/week    1 Cans of beer per week     Comment: social drinker    Review of Systems  Constitutional:       Per HPI, otherwise negative  HENT:       Per HPI, otherwise negative  Respiratory:       Per HPI, otherwise negative  Cardiovascular: Positive for chest pain.       Per HPI, otherwise negative  Gastrointestinal: Positive for nausea. Negative for vomiting and abdominal pain.  Endocrine:       Negative aside from HPI  Genitourinary:       Neg aside from HPI   Musculoskeletal:       Per HPI, otherwise negative  Skin: Negative.   Neurological: Negative for syncope.      Allergies  Shellfish allergy and Mucinex  Home Medications   Prior to Admission medications   Medication Sig  Start Date End Date Taking? Authorizing Provider  albuterol (PROVENTIL HFA;VENTOLIN HFA) 108 (90 BASE) MCG/ACT inhaler Inhale 1-2 puffs into the lungs every 6 (six) hours as needed for wheezing or shortness of breath.    Historical Provider, MD  famotidine (PEPCID) 20 MG tablet Take 1 tablet (20 mg total) by mouth 2 (two) times daily. 05/17/13   [name], PA-C  HYDROcodone-acetaminophen (NORCO/VICODIN) 5-325 MG per tablet Take 1 tablet by mouth every 6 (six) hours as needed for moderate pain. 05/17/13   [name], PA-C  omeprazole (PRILOSEC OTC) 20 MG tablet Take 20 mg by mouth daily.    Historical Provider, MD   There were no vitals taken for this visit. Physical Exam  Nursing note and vitals reviewed. Constitutional: He is oriented to person, place, and time. He appears well-developed. No distress.  Young male with monitoring device on his L ankle  HENT:  Head: Normocephalic and atraumatic.  Eyes: Conjunctivae and EOM are normal.  Cardiovascular: Normal rate and regular rhythm.   Pulmonary/Chest: Effort normal. No stridor. No respiratory distress.  Abdominal: He exhibits no distension.  Musculoskeletal: He exhibits no edema.  Neurological: He is alert and oriented to person, place, and time.  Skin: Skin is warm and dry.  Psychiatric: He has a normal mood and affect.    ED Course  Procedures (including critical care time) Labs Review Labs Reviewed  CBC - Abnormal; Notable for the following:    MCV 71.0 (*)    MCH 24.1 (*)    All other components within normal limits  COMPREHENSIVE METABOLIC PANEL - Abnormal; Notable for the following:    Potassium 3.6 (*)    Glucose, Bld 121 (*)    Total Bilirubin <0.2 (*)    All other components within normal limits  PRO B NATRIURETIC PEPTIDE  MAGNESIUM  PROTIME-INR  TROPONIN I  TROPONIN I    Imaging Review Dg Chest 2 View  11/24/2013   CLINICAL DATA:  Mid chest pain for 12 hr.  History of bronchitis.  EXAM: CHEST   2 VIEW  COMPARISON:  11/14/2012  FINDINGS: The heart size and mediastinal contours are within normal limits. Both lungs are clear. The visualized skeletal structures are unremarkable.  IMPRESSION: No active cardiopulmonary disease.   Electronically Signed   By: Charlett NoseKevin  Dover M.D.   On: 11/24/2013 01:26     EKG Interpretation   Date/Time:  Saturday November 23 2013 23:50:30 EDT Ventricular Rate:  56 PR Interval:  170 QRS Duration: 77 QT Interval:  398 QTC Calculation: 384 R Axis:   63 Text Interpretation:  Sinus rhythm Probable left atrial enlargement ST  elevation suggests acute pericarditis Sinus rhythm ST-t wave abnormality  No significant change since last tracing Abnormal ekg Confirmed by  Anthony Pruitt, Khamila Bassinger  MD ([id number]) on 11/23/2013 11:56:04 PM     1:55 AM Patient in no distress. Troponin #2 pending.  Update: Patient in no distress MDM  Patient presents with new chest pain.  Patient has not used cocaine today, but does have recent use of cocaine. Patient is awake, alert, in no distress.  Patient's labs are unremarkable, with 2 negative troponins. Patient discharged in stable condition.    Anthony Munchobert Maliq Pilley, MD 11/24/13 [phone]

## 2013-11-24 ENCOUNTER — Emergency Department (HOSPITAL_COMMUNITY): Payer: Self-pay

## 2013-11-24 LAB — COMPREHENSIVE METABOLIC PANEL
ALBUMIN: 3.6 g/dL (ref 3.5–5.2)
ALT: 10 U/L (ref 0–53)
ANION GAP: 13 (ref 5–15)
AST: 16 U/L (ref 0–37)
Alkaline Phosphatase: 77 U/L (ref 39–117)
BUN: 12 mg/dL (ref 6–23)
CALCIUM: 8.8 mg/dL (ref 8.4–10.5)
CO2: 22 mEq/L (ref 19–32)
CREATININE: 1.07 mg/dL (ref 0.50–1.35)
Chloride: 102 mEq/L (ref 96–112)
GFR calc non Af Amer: >90 mL/min (ref >90)
Glucose, Bld: 121 mg/dL — ABNORMAL HIGH (ref 70–99)
Potassium: 3.6 mEq/L — ABNORMAL LOW (ref 3.7–5.3)
Sodium: 137 mEq/L (ref 137–147)
TOTAL PROTEIN: 6.5 g/dL (ref 6.0–8.3)
Total Bilirubin: <0.2 mg/dL — ABNORMAL LOW (ref 0.3–1.2)

## 2013-11-24 LAB — CBC
HCT: 40.4 % (ref 39.0–52.0)
Hemoglobin: 13.7 g/dL (ref 13.0–17.0)
MCH: 24.1 pg — AB (ref 26.0–34.0)
MCHC: 33.9 g/dL (ref 30.0–36.0)
MCV: 71 fL — AB (ref 78.0–100.0)
Platelets: 199 10*3/uL (ref 150–400)
RBC: 5.69 MIL/uL (ref 4.22–5.81)
RDW: 13.9 % (ref 11.5–15.5)
WBC: 5.5 10*3/uL (ref 4.0–10.5)

## 2013-11-24 LAB — PROTIME-INR
INR: 0.97 (ref 0.00–1.49)
PROTHROMBIN TIME: 13 s (ref 11.6–15.2)

## 2013-11-24 LAB — MAGNESIUM: Magnesium: 1.9 mg/dL (ref 1.5–2.5)

## 2013-11-24 LAB — TROPONIN I
Troponin I: <0.3 ng/mL (ref <0.30)
Troponin I: <0.3 ng/mL (ref <0.30)

## 2013-11-24 LAB — PRO B NATRIURETIC PEPTIDE: PRO B NATRI PEPTIDE: 16.3 pg/mL (ref 0–125)

## 2013-11-24 MED ORDER — OMEPRAZOLE 20 MG PO CPDR: 20.0000 mg | DELAYED_RELEASE_CAPSULE | Freq: Every day | ORAL | Status: DC

## 2013-11-24 MED ORDER — KETOROLAC TROMETHAMINE 30 MG/ML IJ SOLN
30.0000 mg | Freq: Once | INTRAMUSCULAR | Status: DC
  Filled 2013-11-24: qty 1

## 2013-11-24 MED ORDER — KETOROLAC TROMETHAMINE 30 MG/ML IJ SOLN: 30.0000 mg | Freq: Once | INTRAMUSCULAR | Status: DC

## 2013-11-24 NOTE — Discharge Instructions (Signed)
As discussed, your evaluation today has been largely reassuring.  But, it is important that you monitor your condition carefully, and do not hesitate to return to the ED if you develop new, or concerning changes in your condition. ° °Otherwise, please follow-up with your physician for appropriate ongoing care. ° °Chest Pain (Nonspecific) °It is often hard to give a specific diagnosis for the cause of chest pain. There is always a chance that your pain could be related to something serious, such as a heart attack or a blood clot in the lungs. You need to follow up with your health care provider for further evaluation. °CAUSES  °· Heartburn. °· Pneumonia or bronchitis. °· Anxiety or stress. °· Inflammation around your heart (pericarditis) or lung (pleuritis or pleurisy). °· A blood clot in the lung. °· A collapsed lung (pneumothorax). It can develop suddenly on its own (spontaneous pneumothorax) or from trauma to the chest. °· Shingles infection (herpes zoster virus). °The chest wall is composed of bones, muscles, and cartilage. Any of these can be the source of the pain. °· The bones can be bruised by injury. °· The muscles or cartilage can be strained by coughing or overwork. °· The cartilage can be affected by inflammation and become sore (costochondritis). °DIAGNOSIS  °Lab tests or other studies may be needed to find the cause of your pain. Your health care provider may have you take a test called an ambulatory electrocardiogram (ECG). An ECG records your heartbeat patterns over a 24-hour period. You may also have other tests, such as: °· Transthoracic echocardiogram (TTE). During echocardiography, sound waves are used to evaluate how blood flows through your heart. °· Transesophageal echocardiogram (TEE). °· Cardiac monitoring. This allows your health care provider to monitor your heart rate and rhythm in real time. °· Holter monitor. This is a portable device that records your heartbeat and can help diagnose  heart arrhythmias. It allows your health care provider to track your heart activity for several days, if needed. °· Stress tests by exercise or by giving medicine that makes the heart beat faster. °TREATMENT  °· Treatment depends on what may be causing your chest pain. Treatment may include: °¨ Acid blockers for heartburn. °¨ Anti-inflammatory medicine. °¨ Pain medicine for inflammatory conditions. °¨ Antibiotics if an infection is present. °· You may be advised to change lifestyle habits. This includes stopping smoking and avoiding alcohol, caffeine, and chocolate. °· You may be advised to keep your head raised (elevated) when sleeping. This reduces the chance of acid going backward from your stomach into your esophagus. °Most of the time, nonspecific chest pain will improve within 2-3 days with rest and mild pain medicine.  °HOME CARE INSTRUCTIONS  °· If antibiotics were prescribed, take them as directed. Finish them even if you start to feel better. °· For the next few days, avoid physical activities that bring on chest pain. Continue physical activities as directed. °· Do not use any tobacco products, including cigarettes, chewing tobacco, or electronic cigarettes. °· Avoid drinking alcohol. °· Only take medicine as directed by your health care provider. °· Follow your health care provider's suggestions for further testing if your chest pain does not go away. °· Keep any follow-up appointments you made. If you do not go to an appointment, you could develop lasting (chronic) problems with pain. If there is any problem keeping an appointment, call to reschedule. °SEEK MEDICAL CARE IF:  °· Your chest pain does not go away, even after treatment. °· You have   a rash with blisters on your chest. °· You have a fever. °SEEK IMMEDIATE MEDICAL CARE IF:  °· You have increased chest pain or pain that spreads to your arm, neck, jaw, back, or abdomen. °· You have shortness of breath. °· You have an increasing cough, or you  cough up blood. °· You have severe back or abdominal pain. °· You feel nauseous or vomit. °· You have severe weakness. °· You faint. °· You have chills. °This is an emergency. Do not wait to see if the pain will go away. Get medical help at once. Call your local emergency services (911 in [place] Do not drive yourself to the hospital. °MAKE SURE YOU:  °· Understand these instructions. °· Will watch your condition. °· Will get help right away if you are not doing well or get worse. °Document Released: 10/27/2004 Document Revised: 01/22/2013 Document Reviewed: 08/23/2007 °[employer]® Patient Information ©2015 [employer]. This information is not intended to replace advice given to you by your health care provider. Make sure you discuss any questions you have with your health care provider. ° °

## 2013-11-24 NOTE — ED Notes (Signed)
Pt sts he is currently chest pain free and would like to hold off on pain medication.

## 2014-02-20 ENCOUNTER — Encounter (HOSPITAL_COMMUNITY): Payer: Self-pay

## 2014-02-20 ENCOUNTER — Emergency Department (HOSPITAL_COMMUNITY): Payer: Self-pay

## 2014-02-20 ENCOUNTER — Emergency Department (HOSPITAL_COMMUNITY)
Admission: EM | Admit: 2014-02-20 | Discharge: 2014-02-20 | Disposition: A | Discharge status: Home / Self Care | Payer: Self-pay | Attending: Emergency Medicine | Admitting: Emergency Medicine

## 2014-02-20 DIAGNOSIS — Z8701 Personal history of pneumonia (recurrent): Secondary | ICD-10-CM | POA: Insufficient documentation

## 2014-02-20 DIAGNOSIS — R52 Pain, unspecified: Secondary | ICD-10-CM

## 2014-02-20 DIAGNOSIS — S299XXA Unspecified injury of thorax, initial encounter: Secondary | ICD-10-CM | POA: Insufficient documentation

## 2014-02-20 DIAGNOSIS — Z720 Tobacco use: Secondary | ICD-10-CM | POA: Insufficient documentation

## 2014-02-20 DIAGNOSIS — Z79899 Other long term (current) drug therapy: Secondary | ICD-10-CM | POA: Insufficient documentation

## 2014-02-20 DIAGNOSIS — Z8709 Personal history of other diseases of the respiratory system: Secondary | ICD-10-CM | POA: Insufficient documentation

## 2014-02-20 DIAGNOSIS — Y998 Other external cause status: Secondary | ICD-10-CM | POA: Insufficient documentation

## 2014-02-20 DIAGNOSIS — Y92410 Unspecified street and highway as the place of occurrence of the external cause: Secondary | ICD-10-CM | POA: Insufficient documentation

## 2014-02-20 DIAGNOSIS — K219 Gastro-esophageal reflux disease without esophagitis: Secondary | ICD-10-CM | POA: Insufficient documentation

## 2014-02-20 DIAGNOSIS — S199XXA Unspecified injury of neck, initial encounter: Secondary | ICD-10-CM | POA: Insufficient documentation

## 2014-02-20 DIAGNOSIS — S3992XA Unspecified injury of lower back, initial encounter: Secondary | ICD-10-CM | POA: Insufficient documentation

## 2014-02-20 DIAGNOSIS — Y9389 Activity, other specified: Secondary | ICD-10-CM | POA: Insufficient documentation

## 2014-02-20 MED ORDER — TRAMADOL HCL 50 MG PO TABS: 50.0000 mg | ORAL_TABLET | Freq: Four times a day (QID) | ORAL | Status: DC | PRN

## 2014-02-20 MED ORDER — NAPROXEN 500 MG PO TABS: 500.0000 mg | ORAL_TABLET | Freq: Two times a day (BID) | ORAL | Status: DC

## 2014-02-20 NOTE — Discharge Instructions (Signed)
Follow up as needed
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
## 2014-03-28 ENCOUNTER — Other Ambulatory Visit: Payer: Self-pay

## 2014-04-14 ENCOUNTER — Emergency Department (HOSPITAL_COMMUNITY)
Admission: EM | Admit: 2014-04-14 | Discharge: 2014-04-14 | Discharge status: Against Medical Advice | Payer: Self-pay | Attending: Emergency Medicine | Admitting: Emergency Medicine

## 2014-04-14 ENCOUNTER — Emergency Department (HOSPITAL_COMMUNITY): Payer: Self-pay

## 2014-04-14 ENCOUNTER — Encounter (HOSPITAL_COMMUNITY): Payer: Self-pay | Admitting: Emergency Medicine

## 2014-04-14 DIAGNOSIS — Z720 Tobacco use: Secondary | ICD-10-CM | POA: Insufficient documentation

## 2014-04-14 DIAGNOSIS — R200 Anesthesia of skin: Secondary | ICD-10-CM | POA: Insufficient documentation

## 2014-04-14 DIAGNOSIS — R05 Cough: Secondary | ICD-10-CM | POA: Insufficient documentation

## 2014-04-14 DIAGNOSIS — R079 Chest pain, unspecified: Secondary | ICD-10-CM | POA: Insufficient documentation

## 2014-04-14 NOTE — ED Provider Notes (Signed)
Patient LWBS after triage.  I did not see or evaluate patient.  Garlon HatchetLisa M Huzaifa Viney, PA-C 04/14/14 2352

## 2014-04-14 NOTE — ED Notes (Signed)
Pt c/o non productive cough and pain with cough; pt sts cough x 2 weeks pain x 2 days

## 2014-04-14 NOTE — ED Notes (Signed)
Pt LWBS after triage. Pt here because he started having cp while in court with numbness to fingers. Pt also reports cough x 1 week.

## 2014-04-14 NOTE — ED Notes (Signed)
No answer when called for room 

## 2014-04-14 NOTE — ED Notes (Signed)
Pt ambulating in waiting room, going outside to smoke.

## 2014-04-14 NOTE — ED Notes (Signed)
Called pt in lobby, no answer.  

## 2014-04-14 NOTE — ED Notes (Signed)
Pt not in waiting room when called.   
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
## 2014-08-08 ENCOUNTER — Encounter (HOSPITAL_COMMUNITY): Payer: Self-pay | Admitting: Emergency Medicine

## 2014-08-08 ENCOUNTER — Emergency Department (HOSPITAL_COMMUNITY): Payer: Self-pay

## 2014-08-08 ENCOUNTER — Emergency Department (HOSPITAL_COMMUNITY)
Admission: EM | Admit: 2014-08-08 | Discharge: 2014-08-08 | Disposition: A | Discharge status: Home / Self Care | Payer: Self-pay | Attending: Emergency Medicine | Admitting: Emergency Medicine

## 2014-08-08 DIAGNOSIS — Z720 Tobacco use: Secondary | ICD-10-CM | POA: Insufficient documentation

## 2014-08-08 DIAGNOSIS — Z8719 Personal history of other diseases of the digestive system: Secondary | ICD-10-CM | POA: Insufficient documentation

## 2014-08-08 DIAGNOSIS — R079 Chest pain, unspecified: Secondary | ICD-10-CM

## 2014-08-08 DIAGNOSIS — Z8709 Personal history of other diseases of the respiratory system: Secondary | ICD-10-CM | POA: Insufficient documentation

## 2014-08-08 DIAGNOSIS — R200 Anesthesia of skin: Secondary | ICD-10-CM | POA: Insufficient documentation

## 2014-08-08 DIAGNOSIS — Z8701 Personal history of pneumonia (recurrent): Secondary | ICD-10-CM | POA: Insufficient documentation

## 2014-08-08 LAB — BASIC METABOLIC PANEL
Anion gap: 7 (ref 5–15)
BUN: 8 mg/dL (ref 6–20)
CALCIUM: 9.3 mg/dL (ref 8.9–10.3)
CO2: 25 mmol/L (ref 22–32)
Chloride: 105 mmol/L (ref 101–111)
Creatinine, Ser: 1.28 mg/dL — ABNORMAL HIGH (ref 0.61–1.24)
GFR calc Af Amer: >60 mL/min (ref >60)
Glucose, Bld: 87 mg/dL (ref 65–99)
POTASSIUM: 4.3 mmol/L (ref 3.5–5.1)
Sodium: 137 mmol/L (ref 135–145)

## 2014-08-08 LAB — CBC
HCT: 42.7 % (ref 39.0–52.0)
HEMOGLOBIN: 14.3 g/dL (ref 13.0–17.0)
MCH: 23.7 pg — ABNORMAL LOW (ref 26.0–34.0)
MCHC: 33.5 g/dL (ref 30.0–36.0)
MCV: 70.8 fL — ABNORMAL LOW (ref 78.0–100.0)
Platelets: 241 10*3/uL (ref 150–400)
RBC: 6.03 MIL/uL — ABNORMAL HIGH (ref 4.22–5.81)
RDW: 14.4 % (ref 11.5–15.5)
WBC: 5.9 10*3/uL (ref 4.0–10.5)

## 2014-08-08 NOTE — Discharge Instructions (Signed)

## 2014-08-08 NOTE — ED Provider Notes (Signed)
CSN: [id number]     Arrival date & time 08/08/14  1754 History   First MD Initiated Contact with Patient 08/08/14 1803     Chief Complaint  Patient presents with  . Chest Pain    HPI Patient is a 29 year old male with a history of GERD presenting today for one episode of mild chest pressure that occurred after feelings of numbness in his arms bilaterally. Patient denies any palpitations shortness of breath or near syncopal episodes. He reports that he has had chronic episodes of numbness starting in his right hand that migrate of his arm and radiated to his left arm. After the episode of numbness he had roughly 5 minutes of chest pressure with no associated shortness of breath or radiation. Denies any aggravating or alleviating factors for the chest pain. Not reproducible with walking.  Past Medical History  Diagnosis Date  . Bronchitis   . Acid reflux disease   . Bronchitis   . GERD (gastroesophageal reflux disease)   . Seasonal allergies   . Pneumonia    Past Surgical History  Procedure Laterality Date  . Tonsillectomy    . Adenoidectomy     Family History  Problem Relation Age of Onset  . Diabetes Mother   . Diabetes Father    History  Substance Use Topics  . Smoking status: Current Every Day Smoker -- 1.00 packs/day    Types: Cigarettes  . Smokeless tobacco: Not on file  . Alcohol Use: 0.6 oz/week    1 Cans of beer per week     Comment: social drinker    Review of Systems  Constitutional: Negative for fever and chills.  HENT: Negative for congestion and sore throat.   Eyes: Negative for pain.  Respiratory: Negative for cough and shortness of breath.   Cardiovascular: Positive for chest pain. Negative for palpitations and leg swelling.  Gastrointestinal: Negative for nausea, vomiting, abdominal pain and diarrhea.  Endocrine: Negative.   Genitourinary: Negative for flank pain.  Musculoskeletal: Negative for back pain and neck pain.  Skin: Negative for rash.   Allergic/Immunologic: Negative.   Neurological: Positive for numbness. Negative for dizziness, seizures, syncope, weakness and light-headedness.  Psychiatric/Behavioral: Negative for confusion.     Allergies  Shellfish allergy and Mucinex  Home Medications   Prior to Admission medications   Not on File   BP 130/80 mmHg  Pulse 56  Temp(Src) 98.1 F (36.7 C) (Oral)  Resp 18  Ht  (1.778 m)  Wt 165 lb (74.844 kg)  BMI 23.68 kg/m2  SpO2 99% Physical Exam  Constitutional: He is oriented to person, place, and time. He appears well-developed and well-nourished.  HENT:  Head: Normocephalic and atraumatic.  Eyes: Conjunctivae and EOM are normal. Pupils are equal, round, and reactive to light.  Neck: Normal range of motion. Neck supple.  Cardiovascular: Normal rate, regular rhythm, S1 normal, S2 normal, normal heart sounds and intact distal pulses.   Pulses:      Radial pulses are 2+ on the right side, and 2+ on the left side.  Pulmonary/Chest: Effort normal and breath sounds normal. No respiratory distress. He has no decreased breath sounds.  Abdominal: Soft. Bowel sounds are normal. There is no tenderness.  Musculoskeletal: Normal range of motion.  Neurological: He is alert and oriented to person, place, and time. He has normal strength and normal reflexes. He displays normal reflexes. No cranial nerve deficit or sensory deficit. He displays a negative Romberg sign. GCS eye subscore is 4. GCS  verbal subscore is 5. GCS motor subscore is 6.  Normal finger to nose bilaterally.  Rapid alternating movements intact bilaterally.  Normal heal to shin bilaterally.   No pronator drift bilaterally.    Skin: Skin is warm and dry.    ED Course  Procedures (including critical care time) Labs Review Labs Reviewed  CBC - Abnormal; Notable for the following:    RBC 6.03 (*)    MCV 70.8 (*)    MCH 23.7 (*)    All other components within normal limits  BASIC METABOLIC PANEL -  Abnormal; Notable for the following:    Creatinine, Ser 1.28 (*)    All other components within normal limits    Imaging Review Dg Chest 2 View  08/08/2014   CLINICAL DATA:  Chest pain.  EXAM: CHEST  2 VIEW  COMPARISON:  April 14, 2014.  FINDINGS: The heart size and mediastinal contours are within normal limits. Both lungs are clear. No pneumothorax or pleural effusion is noted. The visualized skeletal structures are unremarkable.  IMPRESSION: No active cardiopulmonary disease.   Electronically Signed   By: Lupita RaiderJames  Green Jr, M.D.   On: 08/08/2014 20:12     EKG Interpretation   Date/Time:  Friday August 08 2014 17:57:30 EDT Ventricular Rate:  70 PR Interval:  152 QRS Duration: 75 QT Interval:  363 QTC Calculation: 392 R Axis:   72 Text Interpretation:  Sinus rhythm Biatrial enlargement ST elev, probable  normal early repol pattern No significant change was found Confirmed by  CAMPOS  MD, KEVIN ([id number]) on 08/08/2014 8:41:52 PM      MDM   Final diagnoses:  Chest pain   On initial evaluation pt HDS in NAD. Neurologic exam with no focal deficits. Described numbness as migratory and not consistent with any dermatomal patterns. No continued numbness on exam. There are any peripheral nerve pathology stroke or TIA. Do not feel imaging warranted at this time. Electrolytes with no acute modalities to explain patient's symptoms.  No pain on evaluation.  Chest x-ray with no pneumonia pneumothorax widened mediastinum. Equal pulses bilateral. EKG with only J point elevation likely early repol.  No WPW, prolonged QT, brugada, or family of of sudden/unexplained death.  Patient PERC negative. Doubt ACS, pneumonia, PE at this time. Advised pt to follow up with PCP in 3-5 days.   If performed, labs, EKGs, and imaging were reviewed/interpreted by myself and my attending and incorporated into medical decision making.  Discussed pertinent finding with patient or caregiver prior to discharge with no further  questions.  Immediate return precautions given and pt or caregiver reports understanding.  Pt care supervised by my attending Dr. Bertram Millardampos  Tahari Clabaugh, MD PGY-2  Emergency Medicine     Tery SanfilippoMatthew Arish Redner, MD 08/09/14 1135  Azalia BilisKevin Campos, MD 08/10/14 [phone]

## 2014-08-08 NOTE — ED Notes (Signed)
The pt is relaxed getting blood drawn

## 2014-08-08 NOTE — ED Notes (Signed)
No discomfort

## 2014-08-08 NOTE — ED Notes (Signed)
No pain   On cell phone

## 2014-08-08 NOTE — ED Notes (Signed)
Pt started having CP today 8/10. Denies n/v. Pt felt lightheaded. Pt received  ASA from EMS and 1  Nitro. Pain now 2/10. BP 130/80,sinus rhythm

## 2014-08-20 ENCOUNTER — Emergency Department (HOSPITAL_COMMUNITY)
Admission: EM | Admit: 2014-08-20 | Discharge: 2014-08-21 | Disposition: A | Discharge status: Home / Self Care | Payer: Self-pay | Attending: Emergency Medicine | Admitting: Emergency Medicine

## 2014-08-20 ENCOUNTER — Emergency Department (HOSPITAL_COMMUNITY): Payer: Self-pay

## 2014-08-20 ENCOUNTER — Encounter (HOSPITAL_COMMUNITY): Payer: Self-pay | Admitting: Emergency Medicine

## 2014-08-20 DIAGNOSIS — R0602 Shortness of breath: Secondary | ICD-10-CM | POA: Insufficient documentation

## 2014-08-20 DIAGNOSIS — R6889 Other general symptoms and signs: Secondary | ICD-10-CM

## 2014-08-20 DIAGNOSIS — R05 Cough: Secondary | ICD-10-CM | POA: Insufficient documentation

## 2014-08-20 DIAGNOSIS — Z8701 Personal history of pneumonia (recurrent): Secondary | ICD-10-CM | POA: Insufficient documentation

## 2014-08-20 DIAGNOSIS — J029 Acute pharyngitis, unspecified: Secondary | ICD-10-CM | POA: Insufficient documentation

## 2014-08-20 DIAGNOSIS — Z720 Tobacco use: Secondary | ICD-10-CM | POA: Insufficient documentation

## 2014-08-20 DIAGNOSIS — J3489 Other specified disorders of nose and nasal sinuses: Secondary | ICD-10-CM | POA: Insufficient documentation

## 2014-08-20 DIAGNOSIS — R51 Headache: Secondary | ICD-10-CM | POA: Insufficient documentation

## 2014-08-20 DIAGNOSIS — Z8719 Personal history of other diseases of the digestive system: Secondary | ICD-10-CM | POA: Insufficient documentation

## 2014-08-20 LAB — BASIC METABOLIC PANEL
Anion gap: 11 (ref 5–15)
BUN: 8 mg/dL (ref 6–20)
CO2: 23 mmol/L (ref 22–32)
Calcium: 9.1 mg/dL (ref 8.9–10.3)
Chloride: 102 mmol/L (ref 101–111)
Creatinine, Ser: 1.32 mg/dL — ABNORMAL HIGH (ref 0.61–1.24)
GFR calc Af Amer: >60 mL/min (ref >60)
Glucose, Bld: 99 mg/dL (ref 65–99)
Potassium: 3.1 mmol/L — ABNORMAL LOW (ref 3.5–5.1)
SODIUM: 136 mmol/L (ref 135–145)

## 2014-08-20 LAB — CBC
HCT: 41.4 % (ref 39.0–52.0)
Hemoglobin: 14 g/dL (ref 13.0–17.0)
MCH: 23.9 pg — ABNORMAL LOW (ref 26.0–34.0)
MCHC: 33.8 g/dL (ref 30.0–36.0)
MCV: 70.8 fL — ABNORMAL LOW (ref 78.0–100.0)
Platelets: 205 10*3/uL (ref 150–400)
RBC: 5.85 MIL/uL — AB (ref 4.22–5.81)
RDW: 14.2 % (ref 11.5–15.5)
WBC: 8.3 10*3/uL (ref 4.0–10.5)

## 2014-08-20 LAB — HEPATIC FUNCTION PANEL
ALT: 25 U/L (ref 17–63)
AST: 23 U/L (ref 15–41)
Albumin: 4.3 g/dL (ref 3.5–5.0)
Alkaline Phosphatase: 75 U/L (ref 38–126)
Total Bilirubin: 0.4 mg/dL (ref 0.3–1.2)
Total Protein: 7.1 g/dL (ref 6.5–8.1)

## 2014-08-20 LAB — I-STAT TROPONIN, ED: TROPONIN I, POC: 0 ng/mL (ref 0.00–0.08)

## 2014-08-20 LAB — LIPASE, BLOOD: Lipase: 18 U/L — ABNORMAL LOW (ref 22–51)

## 2014-08-20 MED ORDER — KETOROLAC TROMETHAMINE 30 MG/ML IJ SOLN
30.0000 mg | Freq: Once | INTRAMUSCULAR | Status: DC
  Filled 2014-08-20: qty 1

## 2014-08-20 MED ORDER — SODIUM CHLORIDE 0.9 % IV BOLUS (SEPSIS)
1000.0000 mL | Freq: Once | INTRAVENOUS | Status: AC
  Administered 2014-08-20: 1000 mL via INTRAVENOUS

## 2014-08-20 MED ORDER — ONDANSETRON HCL 4 MG/2ML IJ SOLN
4.0000 mg | Freq: Once | INTRAMUSCULAR | Status: DC
  Filled 2014-08-20: qty 2

## 2014-08-20 NOTE — ED Notes (Signed)
Pt transported to radiology.

## 2014-08-20 NOTE — ED Provider Notes (Signed)
TIME SEEN: 11:39 PM   CHIEF COMPLAINT: Headache, generalized body aches, Rhinorrhea, Cough  HPI: HPI Comments: Anthony Pruitt is a 29 y.o. male, with no medical conditions, who presents to the Emergency Department complaining of moderate onset of sore throat, rhinorrhea, productive cough, sob, body aches, headache that began 3 days ago. He also reports associated vomiting with last episode earlier today and diarrhea with most recent episode a few hours PTA. He denies associated fever, chills, dysuria. He denies sick contacts. He denies any recent travel. He denies getting a flu shot this year.  ROS: See HPI Constitutional: no fever  Eyes: no drainage  ENT: runny nose; sore throat Cardiovascular:  no chest pain  Resp: SOB; productive cough GI: vomiting; diarrhea GU: no dysuria Integumentary: no rash  Allergy: no hives  Musculoskeletal: no leg swelling  Neurological: no slurred speech; headache ROS otherwise negative  PAST MEDICAL HISTORY/PAST SURGICAL HISTORY:  Past Medical History  Diagnosis Date  . Bronchitis   . Acid reflux disease   . Bronchitis   . GERD (gastroesophageal reflux disease)   . Seasonal allergies   . Pneumonia    MEDICATIONS:  Prior to Admission medications   Not on File    ALLERGIES:  Allergies  Allergen Reactions  . Shellfish Allergy Anaphylaxis and Swelling    Face swells too  . Mucinex [Guaifenesin Er] Other (See Comments)    Jittery, "cant breathe"    SOCIAL HISTORY:  History  Substance Use Topics  . Smoking status: Current Every Day Smoker -- 0.00 packs/day    Types: Cigarettes  . Smokeless tobacco: Not on file  . Alcohol Use: Yes    FAMILY HISTORY: Family History  Problem Relation Age of Onset  . Diabetes Mother   . Diabetes Father     EXAM: Triage Vitals: BP 108/66 mmHg  Pulse 54  Temp(Src) 98 F (36.7 C) (Oral)  Resp 19  SpO2 99%  CONSTITUTIONAL: Alert and oriented and responds appropriately to questions.  Well-appearing; well-nourished; Afebrile and Non-toxic, in no distress HEAD: Normocephalic EYES: Conjunctivae clear, PERRL ENT: normal nose; clear nasal congestion; moist mucous membranes; pharynx without lesions noted; no tonsillar hypertrophy or exudate; no trismus or drooling; no tracheal deviation; normal phonation. NECK: Supple, no meningismus, no LAD  CARD: RRR; S1 and S2 appreciated; no murmurs, no clicks, no rubs, no gallops RESP: Normal chest excursion without splinting or tachypnea; breath sounds clear and equal bilaterally; no wheezes, no rhonchi, no rales, no hypoxia or respiratory distress, speaking full sentences ABD/GI: Normal bowel sounds; non-distended; soft, non-tender, no rebound, no guarding, no peritoneal signs BACK:  The back appears normal and is non-tender to palpation, there is no CVA tenderness EXT: Normal ROM in all joints; non-tender to palpation; no edema; normal capillary refill; no cyanosis, no calf tenderness or swelling    SKIN: Normal color for age and race; warm NEURO: Moves all extremities equally, sensation to light touch intact diffusely, cranial nerves II through XII intact PSYCH: The patient's mood and manner are appropriate. Grooming and personal hygiene are appropriate.  MEDICAL DECISION MAKING: Patient here with symptoms of viral infection. Labs, chest x-ray ordered in triage are unremarkable. Have offered IV fluids, Toradol and Zofran. Patient refusing Toradol and Zofran but agrees IV fluids for symptom control. I do not feel he needs further emergent workup. His exam is benign he is nontoxic-appearing, well-hydrated. Discussed return precautions. Discussed why do not feel anorexic be helpful. He is outside treatment window for Tamiflu if this  was influenza. Discussed alternate Tylenol and ibuprofen for fever and pain. Discussed return precautions. He verbalized understanding and is comfortable with plan.   EKG Interpretation  Date/Time:  Wednesday August 20 2014 22:21:33 EDT Ventricular Rate:  70 PR Interval:  130 QRS Duration: 96 QT Interval:  378 QTC Calculation: 408 R Axis:   80 Text Interpretation:  Normal sinus rhythm Possible Left atrial enlargement Borderline ECG No significant change since last tracing Confirmed by Sandeep Delagarza,  DO, Tiea Manninen [phone]) on 08/20/2014 11:54:04 PM        I personally performed the services described in this documentation, which was scribed in my presence. The recorded information has been reviewed and is accurate.     Layla Maw Moorea Boissonneault, DO 08/21/14 [phone]

## 2014-08-20 NOTE — ED Notes (Addendum)
Pt reports increased sob. Lung sounds clear and equal. C/o pain in mid and upper back with deep breathing. Skin warm and dry. Vs stable.

## 2014-08-20 NOTE — ED Notes (Signed)
Pt. reports central chest pain with SOB , productive cough , emesis and lightheaded onset this evening , pt. added sore throat and headache .

## 2014-08-21 NOTE — Discharge Instructions (Signed)
You may alternate between Tylenol 1000 mg every 6 hours as needed for fever and pain and ibuprofen 800 mg every 8 hours as needed for fever and pain.   Possible Influenza Influenza ("the flu") is a viral infection of the respiratory tract. It occurs more often in winter months because people spend more time in close contact with one another. Influenza can make you feel very sick. Influenza easily spreads from person to person (contagious). CAUSES  Influenza is caused by a virus that infects the respiratory tract. You can catch the virus by breathing in droplets from an infected person's cough or sneeze. You can also catch the virus by touching something that was recently contaminated with the virus and then touching your mouth, nose, or eyes. RISKS AND COMPLICATIONS You may be at risk for a more severe case of influenza if you smoke cigarettes, have diabetes, have chronic heart disease (such as heart failure) or lung disease (such as asthma), or if you have a weakened immune system. Elderly people and pregnant women are also at risk for more serious infections. The most common problem of influenza is a lung infection (pneumonia). Sometimes, this problem can require emergency medical care and may be life threatening. SIGNS AND SYMPTOMS  Symptoms typically last 4 to 10 days and may include:  Fever.  Chills.  Headache, body aches, and muscle aches.  Sore throat.  Chest discomfort and cough.  Poor appetite.  Weakness or feeling tired.  Dizziness.  Nausea or vomiting. DIAGNOSIS  Diagnosis of influenza is often made based on your history and a physical exam. A nose or throat swab test can be done to confirm the diagnosis. TREATMENT  In mild cases, influenza goes away on its own. Treatment is directed at relieving symptoms. For more severe cases, your health care provider may prescribe antiviral medicines to shorten the sickness. Antibiotic medicines are not effective because the infection  is caused by a virus, not by bacteria. HOME CARE INSTRUCTIONS  Take medicines only as directed by your health care provider.  Use a cool mist humidifier to make breathing easier.  Get plenty of rest until your temperature returns to normal. This usually takes 3 to 4 days.  Drink enough fluid to keep your urine clear or pale yellow.  Cover yourmouth and nosewhen coughing or sneezing,and wash your handswellto prevent thevirusfrom spreading.  Stay homefromwork orschool untilthe fever is gonefor at least 31full day. PREVENTION  An annual influenza vaccination (flu shot) is the best way to avoid getting influenza. An annual flu shot is now routinely recommended for all adults in the [place] SEEK MEDICAL CARE IF:  You experiencechest pain, yourcough worsens,or you producemore mucus.  Youhave nausea,vomiting, ordiarrhea.  Your fever returns or gets worse. SEEK IMMEDIATE MEDICAL CARE IF:  You havetrouble breathing, you become short of breath,or your skin ornails becomebluish.  You have severe painor stiffnessin the neck.  You develop a sudden headache, or pain in the face or ear.  You have nausea or vomiting that you cannot control. MAKE SURE YOU:   Understand these instructions.  Will watch your condition.  Will get help right away if you are not doing well or get worse. Document Released: 01/15/2000 Document Revised: 06/03/2013 Document Reviewed: 04/18/2011 [facility] Patient Information 2015 [place], [place]. This information is not intended to replace advice given to you by your health care provider. Make sure you discuss any questions you have with your health care provider.   Viral Infections A viral infection can be  caused by different types of viruses.Most viral infections are not serious and resolve on their own. However, some infections may cause severe symptoms and may lead to further complications. SYMPTOMS Viruses can frequently cause:  Minor  sore throat.  Aches and pains.  Headaches.  Runny nose.  Different types of rashes.  Watery eyes.  Tiredness.  Cough.  Loss of appetite.  Gastrointestinal infections, resulting in nausea, vomiting, and diarrhea. These symptoms do not respond to antibiotics because the infection is not caused by bacteria. However, you might catch a bacterial infection following the viral infection. This is sometimes called a "superinfection." Symptoms of such a bacterial infection may include:  Worsening sore throat with pus and difficulty swallowing.  Swollen neck glands.  Chills and a high or persistent fever.  Severe headache.  Tenderness over the sinuses.  Persistent overall ill feeling (malaise), muscle aches, and tiredness (fatigue).  Persistent cough.  Yellow, green, or brown mucus production with coughing. HOME CARE INSTRUCTIONS   Only take over-the-counter or prescription medicines for pain, discomfort, diarrhea, or fever as directed by your caregiver.  Drink enough water and fluids to keep your urine clear or pale yellow. Sports drinks can provide valuable electrolytes, sugars, and hydration.  Get plenty of rest and maintain proper nutrition. Soups and broths with crackers or rice are fine. SEEK IMMEDIATE MEDICAL CARE IF:   You have severe headaches, shortness of breath, chest pain, neck pain, or an unusual rash.  You have uncontrolled vomiting, diarrhea, or you are unable to keep down fluids.  You or your child has an oral temperature above 102 F (38.9 C), not controlled by medicine.  Your baby is older than 3 months with a rectal temperature of 102 F (38.9 C) or higher.  Your baby is 15 months old or younger with a rectal temperature of 100.4 F (38 C) or higher. MAKE SURE YOU:   Understand these instructions.  Will watch your condition.  Will get help right away if you are not doing well or get worse. Document Released: 10/27/2004 Document Revised:  04/11/2011 Document Reviewed: 05/24/2010 [facility] Patient Information 2015 [place], [place]. This information is not intended to replace advice given to you by your health care provider. Make sure you discuss any questions you have with your health care provider.

## 2015-08-09 IMAGING — DX DG CHEST 2V
2 series · 2 of 2 positions shown · non-contrast
Comparison: Chest radiograph performed 08/08/2014

CLINICAL DATA: Acute onset of shortness of breath and generalized
chest pain. Right upper back pain. Initial encounter.

EXAM:
CHEST  2 VIEW

[w chest pa]
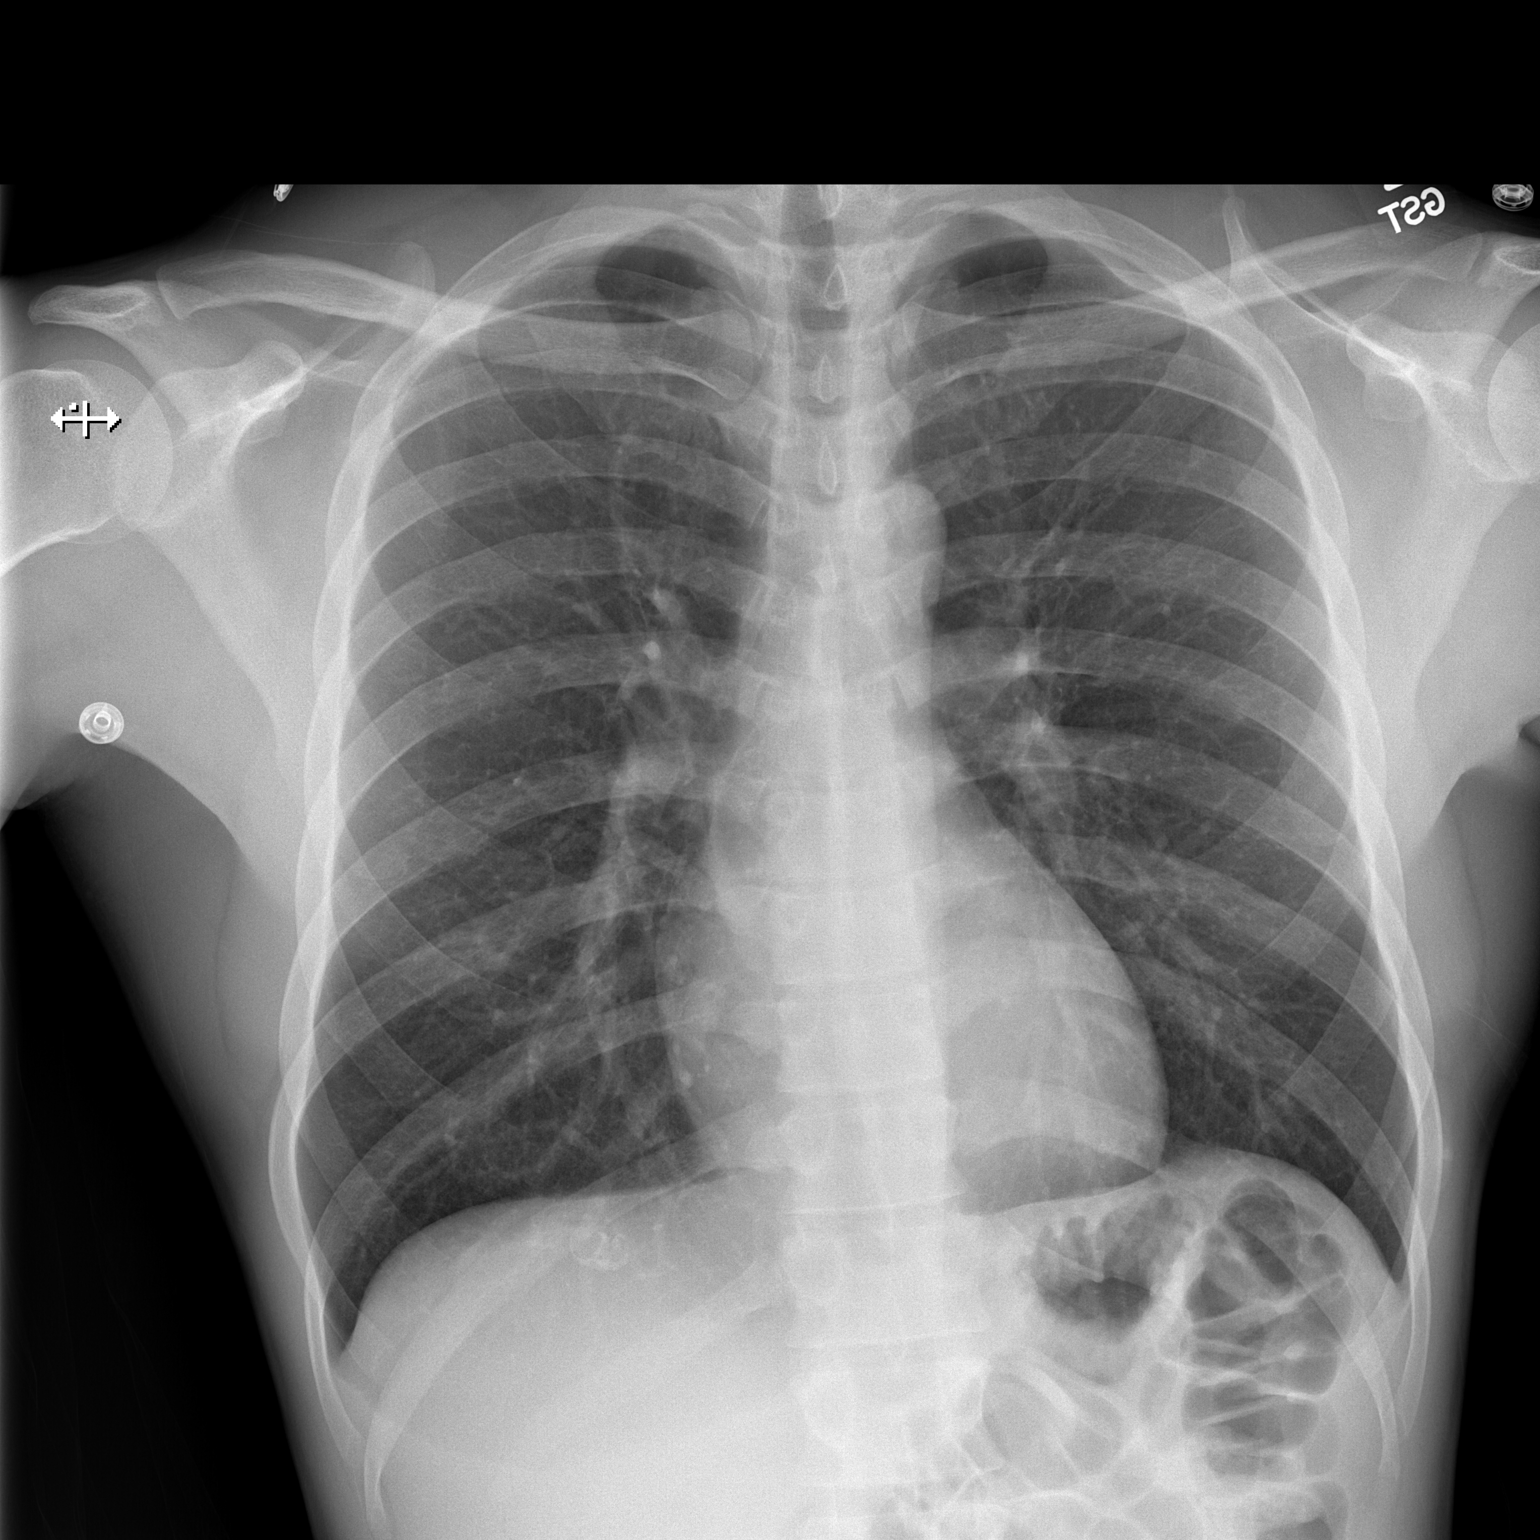

[w chest lat]
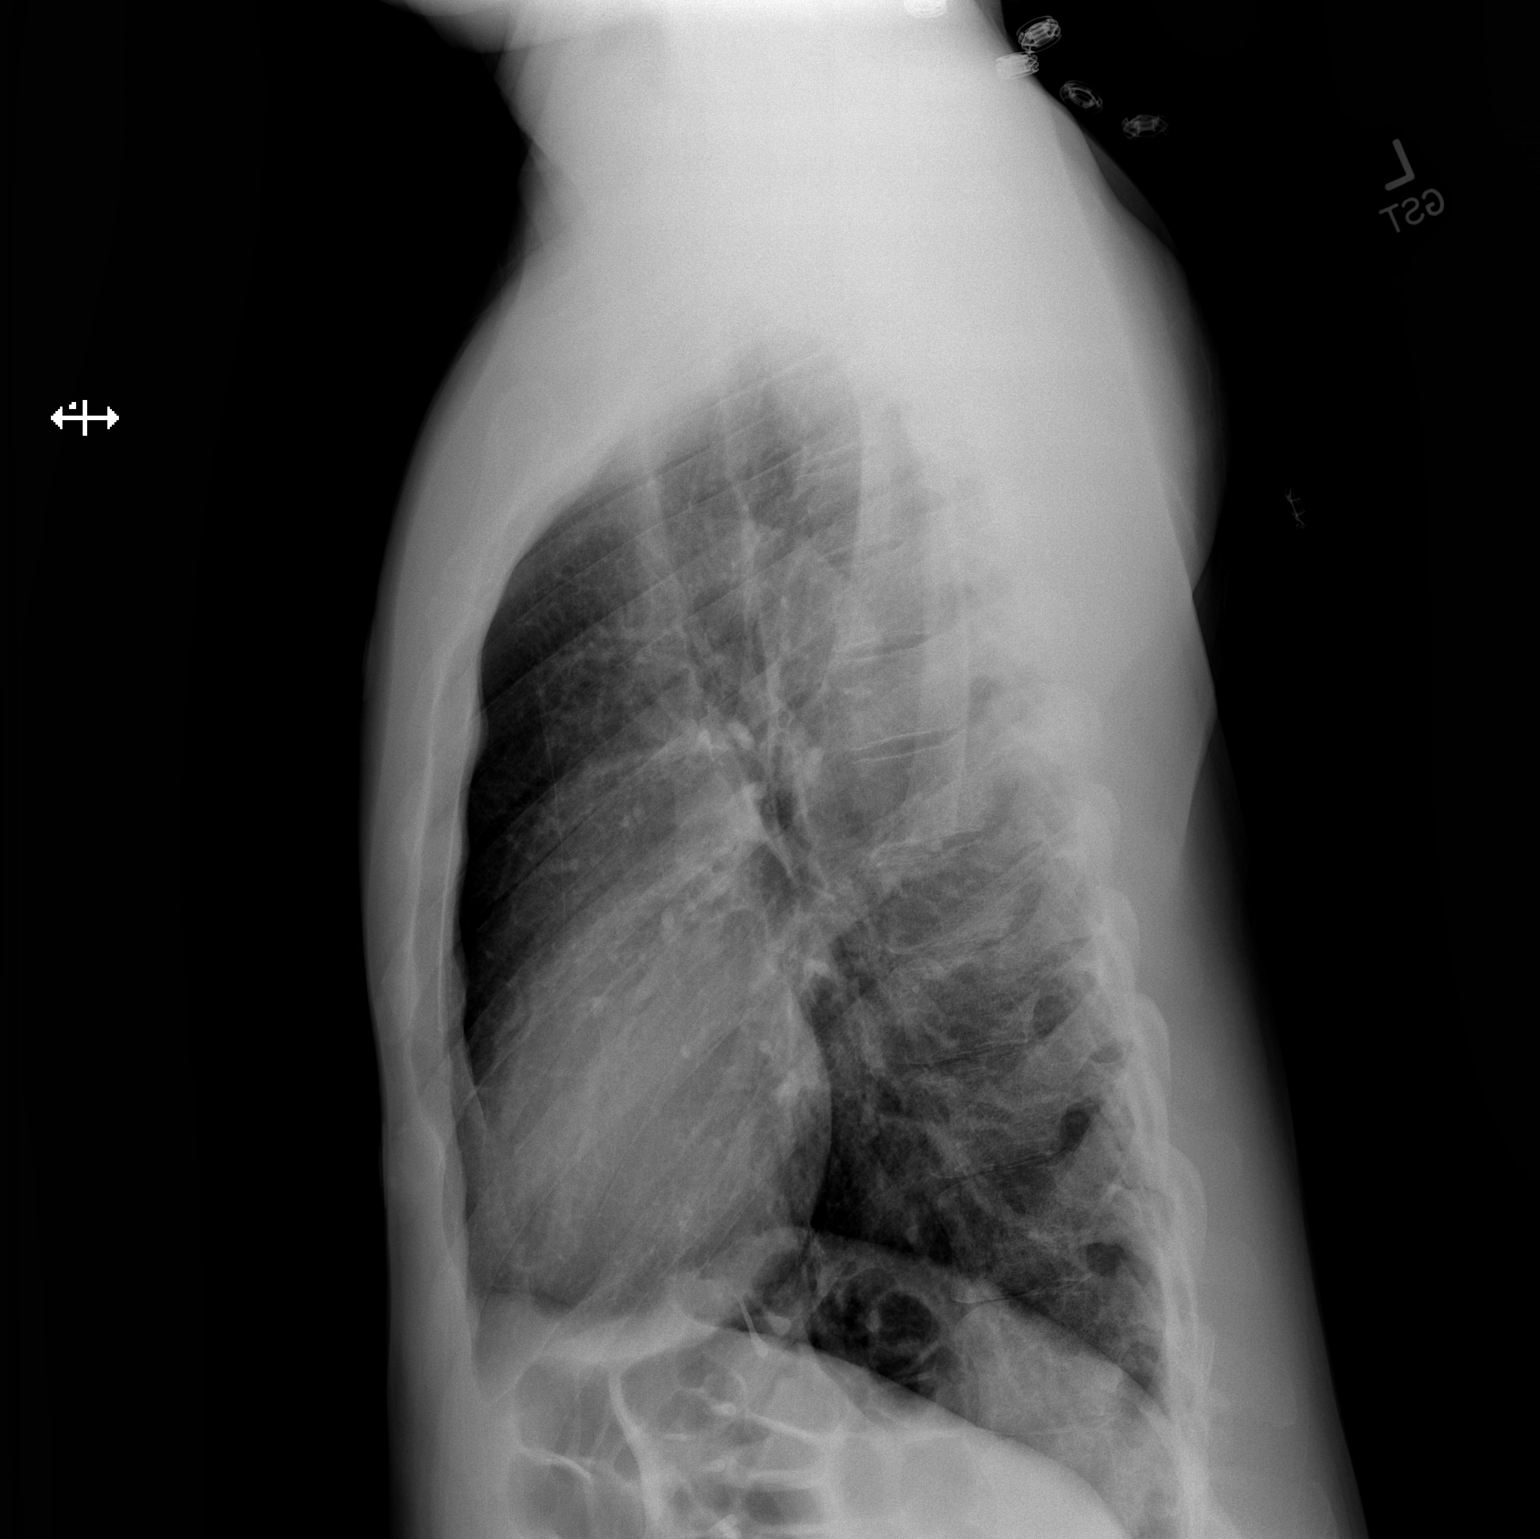

[2 of 2 positions shown; findings below may reference images not displayed]

FINDINGS: The lungs are well-aerated and clear. There is no evidence of focal
opacification, pleural effusion or pneumothorax.

The heart is normal in size; the mediastinal contour is within
normal limits. No acute osseous abnormalities are seen.
IMPRESSION: No acute cardiopulmonary process seen.
